# Patient Record
Sex: Male | Born: 1940 | Race: Black or African American | Hispanic: No | Marital: Married | Smoking: Former smoker
Health system: Southern US, Community
[De-identification: ages and names within clinical notes are randomized; demographics above are authoritative.]

## PROBLEM LIST (undated history)

## (undated) DIAGNOSIS — IMO0002 Reserved for concepts with insufficient information to code with codable children: Secondary | ICD-10-CM

## (undated) DIAGNOSIS — R569 Unspecified convulsions: Secondary | ICD-10-CM

## (undated) DIAGNOSIS — E1140 Type 2 diabetes mellitus with diabetic neuropathy, unspecified: Secondary | ICD-10-CM

## (undated) DIAGNOSIS — N183 Chronic kidney disease, stage 3 unspecified: Secondary | ICD-10-CM

## (undated) DIAGNOSIS — E785 Hyperlipidemia, unspecified: Secondary | ICD-10-CM

## (undated) DIAGNOSIS — I10 Essential (primary) hypertension: Secondary | ICD-10-CM

## (undated) DIAGNOSIS — E1165 Type 2 diabetes mellitus with hyperglycemia: Secondary | ICD-10-CM

## (undated) DIAGNOSIS — I639 Cerebral infarction, unspecified: Secondary | ICD-10-CM

## (undated) DIAGNOSIS — G8194 Hemiplegia, unspecified affecting left nondominant side: Secondary | ICD-10-CM

## (undated) DIAGNOSIS — J45909 Unspecified asthma, uncomplicated: Secondary | ICD-10-CM

## (undated) HISTORY — PX: EYE SURGERY: SHX253

## (undated) HISTORY — DX: Unspecified asthma, uncomplicated: J45.909

## (undated) HISTORY — PX: CARDIAC CATHETERIZATION: SHX172

## (undated) HISTORY — PX: CARDIOVASCULAR STRESS TEST: SHX262

---

## 1998-11-16 DIAGNOSIS — I639 Cerebral infarction, unspecified: Secondary | ICD-10-CM

## 1998-11-16 HISTORY — DX: Cerebral infarction, unspecified: I63.9

## 2007-07-14 ENCOUNTER — Emergency Department (HOSPITAL_COMMUNITY): Admission: EM | Admit: 2007-07-14 | Discharge: 2007-07-15 | Payer: Self-pay | Admitting: Emergency Medicine

## 2011-08-28 LAB — COMPREHENSIVE METABOLIC PANEL
Albumin: 3.9
Alkaline Phosphatase: 57
BUN: 19
Calcium: 9.4
Potassium: 3.2 — ABNORMAL LOW
Total Protein: 8.3

## 2011-08-28 LAB — POCT CARDIAC MARKERS
CKMB, poc: 1.5
Myoglobin, poc: 148
Operator id: 1192
Troponin i, poc: <0.05

## 2011-08-28 LAB — B-NATRIURETIC PEPTIDE (CONVERTED LAB): Pro B Natriuretic peptide (BNP): <30

## 2011-08-28 LAB — DIFFERENTIAL
Basophils Relative: 0
Lymphocytes Relative: 22
Lymphs Abs: 2.8
Monocytes Absolute: 1.4 — ABNORMAL HIGH
Monocytes Relative: 11
Neutro Abs: 8.2 — ABNORMAL HIGH
Neutrophils Relative %: 65

## 2011-08-28 LAB — CBC
HCT: 45.2
MCHC: 33.5
Platelets: 184
RDW: 15.1 — ABNORMAL HIGH

## 2013-10-15 ENCOUNTER — Encounter (HOSPITAL_COMMUNITY): Payer: Self-pay | Admitting: Emergency Medicine

## 2013-10-15 ENCOUNTER — Emergency Department (HOSPITAL_COMMUNITY)
Admission: EM | Admit: 2013-10-15 | Discharge: 2013-10-15 | Disposition: A | Discharge status: Home / Self Care | Payer: Medicare Other | Attending: Emergency Medicine | Admitting: Emergency Medicine

## 2013-10-15 DIAGNOSIS — Z8673 Personal history of transient ischemic attack (TIA), and cerebral infarction without residual deficits: Secondary | ICD-10-CM | POA: Insufficient documentation

## 2013-10-15 DIAGNOSIS — E119 Type 2 diabetes mellitus without complications: Secondary | ICD-10-CM | POA: Insufficient documentation

## 2013-10-15 DIAGNOSIS — Z880 Allergy status to penicillin: Secondary | ICD-10-CM | POA: Insufficient documentation

## 2013-10-15 DIAGNOSIS — R739 Hyperglycemia, unspecified: Secondary | ICD-10-CM

## 2013-10-15 DIAGNOSIS — Z87891 Personal history of nicotine dependence: Secondary | ICD-10-CM | POA: Insufficient documentation

## 2013-10-15 HISTORY — DX: Cerebral infarction, unspecified: I63.9

## 2013-10-15 LAB — BASIC METABOLIC PANEL
BUN: 33 mg/dL — ABNORMAL HIGH (ref 6–23)
CO2: 27 mEq/L (ref 19–32)
Calcium: 9.3 mg/dL (ref 8.4–10.5)
Chloride: 93 mEq/L — ABNORMAL LOW (ref 96–112)
Creatinine, Ser: 1.64 mg/dL — ABNORMAL HIGH (ref 0.50–1.35)
GFR calc Af Amer: 46 mL/min — ABNORMAL LOW (ref >90)
GFR calc non Af Amer: 40 mL/min — ABNORMAL LOW (ref >90)
Glucose, Bld: 529 mg/dL — ABNORMAL HIGH (ref 70–99)
Potassium: 3.6 mEq/L (ref 3.5–5.1)
Sodium: 133 mEq/L — ABNORMAL LOW (ref 135–145)

## 2013-10-15 LAB — URINALYSIS, ROUTINE W REFLEX MICROSCOPIC
Bilirubin Urine: NEGATIVE
Glucose, UA: >1000 mg/dL — AB
Hgb urine dipstick: NEGATIVE
Ketones, ur: NEGATIVE mg/dL
Leukocytes, UA: NEGATIVE
Nitrite: NEGATIVE
Protein, ur: NEGATIVE mg/dL
Specific Gravity, Urine: 1.035 — ABNORMAL HIGH (ref 1.005–1.030)
Urobilinogen, UA: 1 mg/dL (ref 0.0–1.0)
pH: 5.5 (ref 5.0–8.0)

## 2013-10-15 LAB — CBC WITH DIFFERENTIAL/PLATELET
Basophils Absolute: 0 10*3/uL (ref 0.0–0.1)
Basophils Relative: 0 % (ref 0–1)
Eosinophils Absolute: 0.1 10*3/uL (ref 0.0–0.7)
Eosinophils Relative: 1 % (ref 0–5)
HCT: 41.5 % (ref 39.0–52.0)
Hemoglobin: 14.2 g/dL (ref 13.0–17.0)
Lymphocytes Relative: 22 % (ref 12–46)
Lymphs Abs: 1.7 10*3/uL (ref 0.7–4.0)
MCH: 26.4 pg (ref 26.0–34.0)
MCHC: 34.2 g/dL (ref 30.0–36.0)
MCV: 77.3 fL — ABNORMAL LOW (ref 78.0–100.0)
Monocytes Absolute: 0.6 10*3/uL (ref 0.1–1.0)
Monocytes Relative: 8 % (ref 3–12)
Neutro Abs: 5.2 10*3/uL (ref 1.7–7.7)
Neutrophils Relative %: 69 % (ref 43–77)
Platelets: 123 10*3/uL — ABNORMAL LOW (ref 150–400)
RBC: 5.37 MIL/uL (ref 4.22–5.81)
RDW: 13.7 % (ref 11.5–15.5)
WBC: 7.6 10*3/uL (ref 4.0–10.5)

## 2013-10-15 LAB — GLUCOSE, CAPILLARY
Glucose-Capillary: 486 mg/dL — ABNORMAL HIGH (ref 70–99)
Glucose-Capillary: 400 mg/dL — ABNORMAL HIGH (ref 70–99)
Glucose-Capillary: 267 mg/dL — ABNORMAL HIGH (ref 70–99)

## 2013-10-15 LAB — URINE MICROSCOPIC-ADD ON

## 2013-10-15 MED ORDER — SODIUM CHLORIDE 0.9 % IV BOLUS (SEPSIS)
1000.0000 mL | Freq: Once | INTRAVENOUS | Status: AC
  Administered 2013-10-15: 1000 mL via INTRAVENOUS

## 2013-10-15 MED ORDER — INSULIN ASPART 100 UNIT/ML ~~LOC~~ SOLN
15.0000 [IU] | Freq: Once | SUBCUTANEOUS | Status: AC
  Administered 2013-10-15: 15 [IU] via INTRAVENOUS
  Filled 2013-10-15: qty 1

## 2013-10-15 MED ORDER — INSULIN ASPART 100 UNIT/ML ~~LOC~~ SOLN
25.0000 [IU] | Freq: Once | SUBCUTANEOUS | Status: AC
  Administered 2013-10-15: 25 [IU] via INTRAVENOUS
  Filled 2013-10-15: qty 1

## 2013-10-15 NOTE — ED Notes (Signed)
Bed: WA19 Expected date:  Expected time:  Means of arrival:  Comments: EMS 

## 2013-10-15 NOTE — ED Provider Notes (Signed)
CSN: [id number]     Arrival date & time 10/15/13  6734 History   First MD Initiated Contact with Patient 10/15/13 0957     Chief Complaint  Patient presents with  . Hyperglycemia   (Consider location/radiation/quality/duration/timing/severity/associated sxs/prior Treatment) HPI   72 year old male with hyperglycemia. Patient reports that he recently moved to this area and that his new physician changed his insulin dosing. Since then, he reports that his blood sugars have been unusually high for him. Patient is not sure why specifically his insulin regimen was changed. She additionally says that his diet has changed somewhat since moving. He states he eats more carbs such as potatoes. Has been feeling somewhat fatigued. Thursday. Hasn't noticed any changes in his urination. No fevers or chills. No rash. Denies any pain.  Past Medical History  Diagnosis Date  . Stroke 2000    left side deficits  . Diabetes mellitus without complication    Past Surgical History  Procedure Laterality Date  . Eye surgery     No family history on file. History  Substance Use Topics  . Smoking status: Former Games developer  . Smokeless tobacco: Not on file  . Alcohol Use: No    Review of Systems  All systems reviewed and negative, other than as noted in HPI.  Allergies  Coumadin and Penicillins  Home Medications  No current outpatient prescriptions on file. BP 138/103  Pulse 86  Temp(Src) 98.5 F (36.9 C) (Oral)  Resp 20  SpO2 100% Physical Exam  Nursing note and vitals reviewed. Constitutional: He is oriented to person, place, and time. He appears well-developed and well-nourished. No distress.  HENT:  Head: Normocephalic and atraumatic.  Eyes: Conjunctivae are normal. Right eye exhibits no discharge. Left eye exhibits no discharge.  Neck: Neck supple.  Cardiovascular: Normal rate, regular rhythm and normal heart sounds.  Exam reveals no gallop and no friction rub.   No murmur  heard. Pulmonary/Chest: Effort normal and breath sounds normal. No respiratory distress.  Abdominal: Soft. He exhibits no distension. There is no tenderness.  Musculoskeletal: He exhibits no edema and no tenderness.  Neurological: He is alert and oriented to person, place, and time. No cranial nerve deficit. He exhibits normal muscle tone. Coordination normal.  Skin: Skin is warm and dry.  Psychiatric: He has a normal mood and affect. His behavior is normal. Thought content normal.    ED Course  Procedures (including critical care time) Labs Review Labs Reviewed  GLUCOSE, CAPILLARY - Abnormal; Notable for the following:    Glucose-Capillary 486 (*)    All other components within normal limits  BASIC METABOLIC PANEL - Abnormal; Notable for the following:    Sodium 133 (*)    Chloride 93 (*)    Glucose, Bld 529 (*)    BUN 33 (*)    Creatinine, Ser 1.64 (*)    GFR calc non Af Amer 40 (*)    GFR calc Af Amer 46 (*)    All other components within normal limits  CBC WITH DIFFERENTIAL - Abnormal; Notable for the following:    MCV 77.3 (*)    Platelets 123 (*)    All other components within normal limits  URINALYSIS, ROUTINE W REFLEX MICROSCOPIC - Abnormal; Notable for the following:    Specific Gravity, Urine 1.035 (*)    Glucose, UA >1000 (*)    All other components within normal limits  GLUCOSE, CAPILLARY - Abnormal; Notable for the following:    Glucose-Capillary 400 (*)  All other components within normal limits  GLUCOSE, CAPILLARY - Abnormal; Notable for the following:    Glucose-Capillary 267 (*)    All other components within normal limits  URINE MICROSCOPIC-ADD ON   Imaging Review No results found.  EKG Interpretation   None       MDM   1. Hyperglycemia    72 year old male with significant hyperglycemia. No anion gap. No ketonuria. Some renal insufficiency of unknown chronicity. Possibly some element of prerenal with BUN to creatinine ratio of 20-1 and high  specific gravity on urinalysis. Received IV fluids and insulin. Blood sugar has come down into a more reasonable range. Patient had no further complaints. Instructed to keep a detailed log of his blood sugars over the next several days and the need to bring this to his PCP. He needs to make an appointment within the next few days to address this further. Emergent return precautions were discussed.   Raeford Razor, MD 10/18/13 [phone]

## 2013-10-15 NOTE — ED Notes (Addendum)
Per EMS: Hyperglycemic pt BG >600, 18 g right forearm. Denies pain. PCP decreased insulin recently.

## 2013-11-30 ENCOUNTER — Inpatient Hospital Stay (HOSPITAL_COMMUNITY): Payer: Medicare Other

## 2013-11-30 ENCOUNTER — Encounter (HOSPITAL_COMMUNITY): Payer: Self-pay | Admitting: Emergency Medicine

## 2013-11-30 ENCOUNTER — Emergency Department (HOSPITAL_COMMUNITY): Payer: Medicare Other

## 2013-11-30 ENCOUNTER — Inpatient Hospital Stay (HOSPITAL_COMMUNITY)
Admission: EM | Admit: 2013-11-30 | Discharge: 2013-12-02 | DRG: 074 | Disposition: A | Discharge status: Home Health | Payer: Medicare Other | Attending: Internal Medicine | Admitting: Internal Medicine

## 2013-11-30 DIAGNOSIS — N183 Chronic kidney disease, stage 3 unspecified: Secondary | ICD-10-CM | POA: Diagnosis present

## 2013-11-30 DIAGNOSIS — I10 Essential (primary) hypertension: Secondary | ICD-10-CM

## 2013-11-30 DIAGNOSIS — R3589 Other polyuria: Secondary | ICD-10-CM | POA: Diagnosis present

## 2013-11-30 DIAGNOSIS — E1142 Type 2 diabetes mellitus with diabetic polyneuropathy: Secondary | ICD-10-CM | POA: Diagnosis present

## 2013-11-30 DIAGNOSIS — E876 Hypokalemia: Secondary | ICD-10-CM | POA: Diagnosis present

## 2013-11-30 DIAGNOSIS — Z794 Long term (current) use of insulin: Secondary | ICD-10-CM

## 2013-11-30 DIAGNOSIS — G8194 Hemiplegia, unspecified affecting left nondominant side: Secondary | ICD-10-CM

## 2013-11-30 DIAGNOSIS — R5383 Other fatigue: Secondary | ICD-10-CM

## 2013-11-30 DIAGNOSIS — IMO0002 Reserved for concepts with insufficient information to code with codable children: Secondary | ICD-10-CM | POA: Diagnosis present

## 2013-11-30 DIAGNOSIS — R55 Syncope and collapse: Secondary | ICD-10-CM | POA: Diagnosis present

## 2013-11-30 DIAGNOSIS — I69998 Other sequelae following unspecified cerebrovascular disease: Secondary | ICD-10-CM

## 2013-11-30 DIAGNOSIS — I69959 Hemiplegia and hemiparesis following unspecified cerebrovascular disease affecting unspecified side: Secondary | ICD-10-CM

## 2013-11-30 DIAGNOSIS — I129 Hypertensive chronic kidney disease with stage 1 through stage 4 chronic kidney disease, or unspecified chronic kidney disease: Secondary | ICD-10-CM | POA: Diagnosis present

## 2013-11-30 DIAGNOSIS — R5381 Other malaise: Secondary | ICD-10-CM | POA: Diagnosis present

## 2013-11-30 DIAGNOSIS — R358 Other polyuria: Secondary | ICD-10-CM

## 2013-11-30 DIAGNOSIS — B3749 Other urogenital candidiasis: Secondary | ICD-10-CM | POA: Diagnosis present

## 2013-11-30 DIAGNOSIS — E1149 Type 2 diabetes mellitus with other diabetic neurological complication: Principal | ICD-10-CM | POA: Diagnosis present

## 2013-11-30 DIAGNOSIS — E1165 Type 2 diabetes mellitus with hyperglycemia: Secondary | ICD-10-CM

## 2013-11-30 DIAGNOSIS — IMO0001 Reserved for inherently not codable concepts without codable children: Secondary | ICD-10-CM

## 2013-11-30 DIAGNOSIS — G8190 Hemiplegia, unspecified affecting unspecified side: Secondary | ICD-10-CM | POA: Diagnosis present

## 2013-11-30 DIAGNOSIS — E1140 Type 2 diabetes mellitus with diabetic neuropathy, unspecified: Secondary | ICD-10-CM

## 2013-11-30 HISTORY — DX: Chronic kidney disease, stage 3 (moderate): N18.3

## 2013-11-30 HISTORY — DX: Hemiplegia, unspecified affecting left nondominant side: G81.94

## 2013-11-30 HISTORY — DX: Chronic kidney disease, stage 3 unspecified: N18.30

## 2013-11-30 HISTORY — DX: Essential (primary) hypertension: I10

## 2013-11-30 HISTORY — DX: Type 2 diabetes mellitus with diabetic neuropathy, unspecified: E11.40

## 2013-11-30 HISTORY — DX: Reserved for concepts with insufficient information to code with codable children: IMO0002

## 2013-11-30 HISTORY — DX: Type 2 diabetes mellitus with hyperglycemia: E11.65

## 2013-11-30 LAB — BASIC METABOLIC PANEL
BUN: 35 mg/dL — ABNORMAL HIGH (ref 6–23)
CO2: 31 mEq/L (ref 19–32)
Calcium: 9.9 mg/dL (ref 8.4–10.5)
Chloride: 88 mEq/L — ABNORMAL LOW (ref 96–112)
Creatinine, Ser: 1.47 mg/dL — ABNORMAL HIGH (ref 0.50–1.35)
GFR calc non Af Amer: 46 mL/min — ABNORMAL LOW (ref >90)
GFR, EST AFRICAN AMERICAN: 53 mL/min — AB (ref >90)
GLUCOSE: 227 mg/dL — AB (ref 70–99)
POTASSIUM: 2.8 meq/L — AB (ref 3.7–5.3)
SODIUM: 135 meq/L — AB (ref 137–147)

## 2013-11-30 LAB — CBC WITH DIFFERENTIAL/PLATELET
BASOS PCT: 0 % (ref 0–1)
Basophils Absolute: 0 10*3/uL (ref 0.0–0.1)
EOS ABS: 0.1 10*3/uL (ref 0.0–0.7)
Eosinophils Relative: 1 % (ref 0–5)
HCT: 43.9 % (ref 39.0–52.0)
Hemoglobin: 14.6 g/dL (ref 13.0–17.0)
LYMPHS ABS: 1.8 10*3/uL (ref 0.7–4.0)
Lymphocytes Relative: 22 % (ref 12–46)
MCH: 25.4 pg — AB (ref 26.0–34.0)
MCHC: 33.3 g/dL (ref 30.0–36.0)
MCV: 76.3 fL — ABNORMAL LOW (ref 78.0–100.0)
Monocytes Absolute: 0.5 10*3/uL (ref 0.1–1.0)
Monocytes Relative: 5 % (ref 3–12)
NEUTROS ABS: 6 10*3/uL (ref 1.7–7.7)
NEUTROS PCT: 72 % (ref 43–77)
Platelets: 180 10*3/uL (ref 150–400)
RBC: 5.75 MIL/uL (ref 4.22–5.81)
RDW: 13.5 % (ref 11.5–15.5)
WBC: 8.4 10*3/uL (ref 4.0–10.5)

## 2013-11-30 LAB — PRO B NATRIURETIC PEPTIDE: Pro B Natriuretic peptide (BNP): 24 pg/mL (ref 0–125)

## 2013-11-30 LAB — URINALYSIS, ROUTINE W REFLEX MICROSCOPIC
Bilirubin Urine: NEGATIVE
Glucose, UA: NEGATIVE mg/dL
Hgb urine dipstick: NEGATIVE
Ketones, ur: NEGATIVE mg/dL
NITRITE: NEGATIVE
Protein, ur: NEGATIVE mg/dL
SPECIFIC GRAVITY, URINE: 1.012 (ref 1.005–1.030)
Urobilinogen, UA: 0.2 mg/dL (ref 0.0–1.0)
pH: 5.5 (ref 5.0–8.0)

## 2013-11-30 LAB — POCT I-STAT, CHEM 8
BUN: 35 mg/dL — ABNORMAL HIGH (ref 6–23)
Calcium, Ion: 1.17 mmol/L (ref 1.13–1.30)
Chloride: 89 mEq/L — ABNORMAL LOW (ref 96–112)
Creatinine, Ser: 1.6 mg/dL — ABNORMAL HIGH (ref 0.50–1.35)
Glucose, Bld: 220 mg/dL — ABNORMAL HIGH (ref 70–99)
HEMATOCRIT: 55 % — AB (ref 39.0–52.0)
HEMOGLOBIN: 18.7 g/dL — AB (ref 13.0–17.0)
POTASSIUM: 2.8 meq/L — AB (ref 3.7–5.3)
SODIUM: 136 meq/L — AB (ref 137–147)
TCO2: 32 mmol/L (ref 0–100)

## 2013-11-30 LAB — CBC
HCT: 44.3 % (ref 39.0–52.0)
Hemoglobin: 15 g/dL (ref 13.0–17.0)
MCH: 26 pg (ref 26.0–34.0)
MCHC: 33.9 g/dL (ref 30.0–36.0)
MCV: 76.9 fL — ABNORMAL LOW (ref 78.0–100.0)
PLATELETS: 195 10*3/uL (ref 150–400)
RBC: 5.76 MIL/uL (ref 4.22–5.81)
RDW: 13.6 % (ref 11.5–15.5)
WBC: 11.5 10*3/uL — AB (ref 4.0–10.5)

## 2013-11-30 LAB — CREATININE, SERUM
CREATININE: 1.53 mg/dL — AB (ref 0.50–1.35)
GFR, EST AFRICAN AMERICAN: 50 mL/min — AB (ref >90)
GFR, EST NON AFRICAN AMERICAN: 43 mL/min — AB (ref >90)

## 2013-11-30 LAB — GLUCOSE, CAPILLARY
GLUCOSE-CAPILLARY: 355 mg/dL — AB (ref 70–99)
Glucose-Capillary: 185 mg/dL — ABNORMAL HIGH (ref 70–99)

## 2013-11-30 LAB — MAGNESIUM: Magnesium: 2 mg/dL (ref 1.5–2.5)

## 2013-11-30 LAB — POCT I-STAT TROPONIN I: Troponin i, poc: 0 ng/mL (ref 0.00–0.08)

## 2013-11-30 LAB — URINE MICROSCOPIC-ADD ON

## 2013-11-30 LAB — TROPONIN I

## 2013-11-30 MED ORDER — INSULIN ASPART 100 UNIT/ML ~~LOC~~ SOLN
0.0000 [IU] | Freq: Three times a day (TID) | SUBCUTANEOUS | Status: DC
  Administered 2013-12-01: 18:00:00 8 [IU] via SUBCUTANEOUS
  Administered 2013-12-01: 3 [IU] via SUBCUTANEOUS
  Administered 2013-12-02: 08:00:00 2 [IU] via SUBCUTANEOUS

## 2013-11-30 MED ORDER — ACETAMINOPHEN 650 MG RE SUPP: 650.0000 mg | Freq: Four times a day (QID) | RECTAL | Status: DC | PRN

## 2013-11-30 MED ORDER — POTASSIUM CHLORIDE 20 MEQ/15ML (10%) PO LIQD
40.0000 meq | Freq: Once | ORAL | Status: AC
  Administered 2013-11-30: 40 meq via ORAL
  Filled 2013-11-30: qty 30

## 2013-11-30 MED ORDER — DOCUSATE SODIUM 100 MG PO CAPS
100.0000 mg | ORAL_CAPSULE | Freq: Two times a day (BID) | ORAL | Status: DC | PRN
  Administered 2013-12-01: 21:00:00 100 mg via ORAL
  Filled 2013-11-30: qty 1

## 2013-11-30 MED ORDER — POTASSIUM CHLORIDE CRYS ER 20 MEQ PO TBCR
20.0000 meq | EXTENDED_RELEASE_TABLET | Freq: Two times a day (BID) | ORAL | Status: DC
  Administered 2013-11-30 – 2013-12-02 (×4): 20 meq via ORAL
  Filled 2013-11-30 (×5): qty 1

## 2013-11-30 MED ORDER — HYDROCODONE-ACETAMINOPHEN 5-325 MG PO TABS: 1.0000 | ORAL_TABLET | ORAL | Status: DC | PRN

## 2013-11-30 MED ORDER — INSULIN GLARGINE 100 UNIT/ML ~~LOC~~ SOLN
60.0000 [IU] | Freq: Every day | SUBCUTANEOUS | Status: DC
  Administered 2013-11-30 – 2013-12-01 (×2): 60 [IU] via SUBCUTANEOUS
  Filled 2013-11-30 (×2): qty 0.6

## 2013-11-30 MED ORDER — POTASSIUM CHLORIDE 10 MEQ/100ML IV SOLN
10.0000 meq | Freq: Once | INTRAVENOUS | Status: AC
  Administered 2013-11-30: 10 meq via INTRAVENOUS
  Filled 2013-11-30: qty 100

## 2013-11-30 MED ORDER — SODIUM CHLORIDE 0.9 % IJ SOLN: 3.0000 mL | Freq: Two times a day (BID) | INTRAMUSCULAR | Status: DC

## 2013-11-30 MED ORDER — INSULIN ASPART 100 UNIT/ML ~~LOC~~ SOLN
55.0000 [IU] | Freq: Three times a day (TID) | SUBCUTANEOUS | Status: DC
  Administered 2013-12-01: 09:00:00 55 [IU] via SUBCUTANEOUS

## 2013-11-30 MED ORDER — FLUCONAZOLE 100 MG PO TABS
100.0000 mg | ORAL_TABLET | ORAL | Status: DC
  Administered 2013-11-30 – 2013-12-01 (×2): 100 mg via ORAL
  Filled 2013-11-30 (×3): qty 1

## 2013-11-30 MED ORDER — SODIUM CHLORIDE 0.9 % IV SOLN
Freq: Once | INTRAVENOUS | Status: AC
  Administered 2013-11-30: 13:00:00 via INTRAVENOUS

## 2013-11-30 MED ORDER — ACETAMINOPHEN 325 MG PO TABS: 650.0000 mg | ORAL_TABLET | Freq: Four times a day (QID) | ORAL | Status: DC | PRN

## 2013-11-30 MED ORDER — ACETAMINOPHEN 325 MG PO TABS
650.0000 mg | ORAL_TABLET | Freq: Once | ORAL | Status: AC
  Administered 2013-11-30: 650 mg via ORAL
  Filled 2013-11-30: qty 2

## 2013-11-30 MED ORDER — LOSARTAN POTASSIUM 50 MG PO TABS
50.0000 mg | ORAL_TABLET | Freq: Every day | ORAL | Status: DC
  Administered 2013-12-01 – 2013-12-02 (×2): 50 mg via ORAL
  Filled 2013-11-30 (×2): qty 1

## 2013-11-30 MED ORDER — LEVOFLOXACIN 750 MG PO TABS
750.0000 mg | ORAL_TABLET | ORAL | Status: DC
  Administered 2013-11-30: 750 mg via ORAL
  Filled 2013-11-30: qty 1

## 2013-11-30 MED ORDER — POTASSIUM CHLORIDE IN NACL 40-0.9 MEQ/L-% IV SOLN
INTRAVENOUS | Status: DC
  Administered 2013-11-30 – 2013-12-02 (×4): via INTRAVENOUS
  Filled 2013-11-30 (×4): qty 1000

## 2013-11-30 MED ORDER — CHLORTHALIDONE 25 MG PO TABS
25.0000 mg | ORAL_TABLET | Freq: Every day | ORAL | Status: DC
  Administered 2013-12-01: 11:00:00 25 mg via ORAL
  Filled 2013-11-30: qty 1

## 2013-11-30 MED ORDER — ADULT MULTIVITAMIN W/MINERALS CH
1.0000 | ORAL_TABLET | Freq: Every day | ORAL | Status: DC
  Administered 2013-11-30 – 2013-12-02 (×3): 1 via ORAL
  Filled 2013-11-30 (×3): qty 1

## 2013-11-30 MED ORDER — SODIUM CHLORIDE 0.9 % IV BOLUS (SEPSIS)
1000.0000 mL | Freq: Once | INTRAVENOUS | Status: AC
  Administered 2013-11-30: 1000 mL via INTRAVENOUS

## 2013-11-30 MED ORDER — POTASSIUM CHLORIDE 20 MEQ/15ML (10%) PO LIQD: 40.0000 meq | Freq: Once | ORAL | Status: DC

## 2013-11-30 MED ORDER — HEPARIN SODIUM (PORCINE) 5000 UNIT/ML IJ SOLN
5000.0000 [IU] | Freq: Three times a day (TID) | INTRAMUSCULAR | Status: DC
  Administered 2013-11-30 – 2013-12-02 (×5): 5000 [IU] via SUBCUTANEOUS
  Filled 2013-11-30 (×8): qty 1

## 2013-11-30 NOTE — Progress Notes (Signed)
ANTIBIOTIC CONSULT NOTE - INITIAL  Pharmacy Consult for Levaquin Indication: UTI  Allergies  Allergen Reactions  . Coumadin [Warfarin Sodium]     Heart races  . Penicillins Hives    Patient Measurements:     Vital Signs: Temp: 97.6 F (36.4 C) (01/15 1740) Temp src: Oral (01/15 1740) BP: 123/108 mmHg (01/15 1750) Pulse Rate: 111 (01/15 1750) Intake/Output from previous day:   Intake/Output from this shift:    Labs:  Recent Labs  11/30/13 1200 11/30/13 1244  WBC 8.4  --   HGB 14.6 18.7*  PLT 180  --   CREATININE 1.47* 1.60*   CrCl is unknown because there is no height on file for the current visit. No results found for this basename: VANCOTROUGH, VANCOPEAK, VANCORANDOM, GENTTROUGH, GENTPEAK, GENTRANDOM, TOBRATROUGH, TOBRAPEAK, TOBRARND, AMIKACINPEAK, AMIKACINTROU, AMIKACIN,  in the last 72 hours   Microbiology: No results found for this or any previous visit (from the past 720 hour(s)).  Medical History: Past Medical History  Diagnosis Date  . Stroke 2000    left side deficits  . Uncontrolled diabetes mellitus   . Left hemiparesis   . Hypertension   . Diabetic neuropathy   . Chronic progressive renal failure, stage 3 (moderate)     Medications:  Prescriptions prior to admission  Medication Sig Dispense Refill  . chlorthalidone (HYGROTON) 25 MG tablet Take 25 mg by mouth daily.      . cholecalciferol (VITAMIN D) 1000 UNITS tablet Take 1,000 Units by mouth daily.      . insulin glargine (LANTUS) 100 UNIT/ML injection Inject 60 Units into the skin at bedtime.      . insulin lispro (HUMALOG) 100 UNIT/ML injection Inject 60 Units into the skin 3 (three) times daily before meals.      Marland Kitchen. losartan (COZAAR) 50 MG tablet Take 50 mg by mouth daily.      . Multiple Vitamins-Minerals (MULTIVITAMIN WITH MINERALS) tablet Take 1 tablet by mouth daily.      . potassium chloride (K-DUR,KLOR-CON) 10 MEQ tablet Take 10 mEq by mouth daily.      . vitamin E 1000 UNIT  capsule Take 1,000 Units by mouth daily.       Assessment: 72yo M passed out. UA turbid and positive for yeast. Fluconazole ordered x 3 and pharmacy is asked to dose Levaquin for possibility of bacterial UTI.  SCr elevated at 1.6. CrCl ~ 41N.  Goal of Therapy:  Eradication of infection Appropriate dosing for renal function   Plan:  Levaquin 750mg  PO q48h. Duration? Agree with fluconazole 100mg  PO q24h x 3. Follow up renal fxn and culture results.  Charolotte Ekeom Shariyah Eland, PharmD, pager [phone]. 11/30/2013,6:20 PM.

## 2013-11-30 NOTE — ED Notes (Signed)
Pt states was sitting on toilet; found by wife laying in floor between commode and sink; pt states felt weak; feels like is related to low sugar--cbg 237 in triage;

## 2013-11-30 NOTE — ED Notes (Signed)
CRITICAL VALUE ALERT  Critical value received:  Potassium 2.8 mEq/L Date of notification:  11/30/2013 Time of notification:  1326 Critical value read back:yes Nurse who received alert: Kai LevinsJeremy Mayelin Panos, RN MD notified: Expected result, same result as i-stat, pt already receiving oral and IV potassium

## 2013-11-30 NOTE — H&P (Signed)
History and Physical Examination   Alejandro [name] ZOX:[id number] DOB: 1941/10/27 DOA: 11/30/2013  Referring physician: Wilkie Aye, MD PCP: Alejandro German, MD   Chief Complaint: passed out   HPI: Alejandro [name] is a 73 y.o. 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  He has had 2 other similar episodes where he just passed out for no reason. The patient states that he remembers being in the bathroom about to check his blood sugar and the next thing he remembers is his wife waking him up on the floor. Patient has no memory of events. He did not have any cough, aura or presyncope feelings. He denies any dizziness, chest pain and shortness of breath.  He denies any recent illnesses. He states that he "just doesn't feel great."  He has been experiencing progressive weakness over the past several days. He has also experienced cramping in his legs.   In the ER the patient was evaluated and found to have significant hypokalemia.  He takes a diuretic daily for hypertension called chlorthalidone.  He was not found to be orthostatic.  A hospital admission was requested for further evaluation and management.  He did not show any confusion although he did show some weakness and confusion right after the event when his wife discovered him on the bathroom floor.  She had to call her daughter to come and help him to get off the bathroom floor. An initial CT scan was negative.       Past Medical History Past Medical History  Diagnosis Date  . Stroke 2000    left side deficits  . Uncontrolled diabetes mellitus   . Left hemiparesis   . Hypertension   . Diabetic neuropathy   . Chronic progressive renal failure, stage 3 (moderate)    Past Surgical History Past Surgical History   Procedure Laterality Date  . Eye surgery    . Cardiac catheterization    . Cardiovascular stress test     Home Meds: Prior to Admission medications   Medication Sig Start Date End Date Taking? Authorizing Provider  chlorthalidone (HYGROTON) 25 MG tablet Take 25 mg by mouth daily.   Yes Historical Provider, MD  cholecalciferol (VITAMIN D) 1000 UNITS tablet Take 1,000 Units by mouth daily.   Yes Historical Provider, MD  insulin glargine (LANTUS) 100 UNIT/ML injection Inject 60 Units into the skin at bedtime.   Yes Historical Provider, MD  insulin lispro (HUMALOG) 100 UNIT/ML injection Inject 60 Units into the skin 3 (three) times daily before meals.   Yes Historical Provider, MD  losartan (COZAAR) 50 MG tablet Take 50 mg by mouth daily.   Yes Historical Provider, MD  Multiple Vitamins-Minerals (MULTIVITAMIN WITH MINERALS) tablet Take 1 tablet by mouth daily.   Yes Historical Provider, MD  potassium chloride (K-DUR,KLOR-CON) 10 MEQ tablet Take 10 mEq by mouth daily.   Yes Historical Provider, MD  vitamin E 1000 UNIT capsule Take 1,000 Units by mouth daily.   Yes Historical Provider, MD   Allergies: Coumadin and Penicillins  Social History:  History   Social History  . Marital Status: Married    Spouse Name: N/A    Number of Children: N/A  . Years of Education: N/A   Occupational History  . Not on file.   Social History Main Topics  . Smoking  status: Former Games developer  . Smokeless tobacco: Not on file  . Alcohol Use: No  . Drug Use: No  . Sexual Activity: Not on file   Other Topics Concern  . Not on file   Social History Narrative  . No narrative on file   Family History:  Family History  Problem Relation Age of Onset  . Hypertension     Review of Systems:  Review of Systems  Constitutional: Negative for fever, chills, weight loss and malaise/fatigue.  HENT: Negative for congestion, ear discharge, ear pain, hearing loss, nosebleeds and tinnitus.   Eyes: Positive for  blurred vision. Negative for double vision, photophobia, pain, discharge and redness.  Respiratory: Negative for cough, hemoptysis, sputum production, shortness of breath, wheezing and stridor.   Cardiovascular: Negative for chest pain, palpitations, orthopnea, claudication, leg swelling and PND.  Gastrointestinal: Negative for heartburn, nausea, vomiting, abdominal pain and diarrhea.  Genitourinary: Positive for urgency and frequency. Negative for dysuria, hematuria and flank pain.  Musculoskeletal: Positive for falls, joint pain and myalgias. Negative for neck pain.       Left shoulder pain   Skin: Positive for itching. Negative for rash.  Neurological: Positive for loss of consciousness and weakness. Negative for dizziness, tingling, tremors, focal weakness, seizures and headaches.  Endo/Heme/Allergies: Negative for environmental allergies and polydipsia. Does not bruise/bleed easily.  Psychiatric/Behavioral: Negative for depression, suicidal ideas, hallucinations, memory loss and substance abuse. The patient is not nervous/anxious and does not have insomnia.   All other systems reviewed and are negative.  Physical Exam: Blood pressure 95/69, pulse 104, temperature 98 F (36.7 C), temperature source Oral, resp. rate 16, SpO2 95.00%. General appearance: alert, cooperative, appears stated age and no distress Head: Normocephalic, without obvious abnormality, atraumatic Eyes: negative findings: lids and lashes normal, conjunctivae and sclerae normal, corneas clear, pupils equal, round, reactive to light and accomodation and visual fields full to confrontation Nose: Nares normal. Septum midline. Mucosa normal. No drainage or sinus tenderness., no discharge, no sinus tenderness Throat: abnormal findings: cheilitis Neck: no adenopathy, no carotid bruit, no JVD, supple, symmetrical, trachea midline and thyroid not enlarged, symmetric, no tenderness/mass/nodules Back: symmetric, no curvature. ROM  normal. No CVA tenderness. Lungs: clear to auscultation bilaterally and normal percussion bilaterally Chest wall: no tenderness Heart: regular rate and rhythm and S1, S2 normal Abdomen: soft, non-tender; bowel sounds normal; no masses,  no organomegaly Extremities: extremities normal, atraumatic, no cyanosis or edema Pulses: 2+ and symmetric Skin: Skin color, texture, turgor normal. No rashes or lesions Lymph nodes: Cervical, supraclavicular, and axillary nodes normal. Neurologic: Mental status: Alert, oriented, thought content appropriate Motor: pronounced left hemiparesis, contracture of left upper extremity Reflexes: 2+ RUE/RLE, 1+ LLE  Lab  And Imaging results:  Results for orders placed during the hospital encounter of 11/30/13 (from the past 24 hour(s))  CBC WITH DIFFERENTIAL     Status: Abnormal   Collection Time    11/30/13 12:00 PM      Result Value Range   WBC 8.4  4.0 - 10.5 K/uL   RBC 5.75  4.22 - 5.81 MIL/uL   Hemoglobin 14.6  13.0 - 17.0 g/dL   HCT 16.1  09.6 - 04.5 %   MCV 76.3 (*) 78.0 - 100.0 fL   MCH 25.4 (*) 26.0 - 34.0 pg   MCHC 33.3  30.0 - 36.0 g/dL   RDW 40.9  81.1 - 91.4 %   Platelets 180  150 - 400 K/uL   Neutrophils Relative % 72  43 - 77 %   Neutro Abs 6.0  1.7 - 7.7 K/uL   Lymphocytes Relative 22  12 - 46 %   Lymphs Abs 1.8  0.7 - 4.0 K/uL   Monocytes Relative 5  3 - 12 %   Monocytes Absolute 0.5  0.1 - 1.0 K/uL   Eosinophils Relative 1  0 - 5 %   Eosinophils Absolute 0.1  0.0 - 0.7 K/uL   Basophils Relative 0  0 - 1 %   Basophils Absolute 0.0  0.0 - 0.1 K/uL  BASIC METABOLIC PANEL     Status: Abnormal   Collection Time    11/30/13 12:00 PM      Result Value Range   Sodium 135 (*) 137 - 147 mEq/L   Potassium 2.8 (*) 3.7 - 5.3 mEq/L   Chloride 88 (*) 96 - 112 mEq/L   CO2 31  19 - 32 mEq/L   Glucose, Bld 227 (*) 70 - 99 mg/dL   BUN 35 (*) 6 - 23 mg/dL   Creatinine, Ser 1.611.47 (*) 0.50 - 1.35 mg/dL   Calcium 9.9  8.4 - 09.610.5 mg/dL   GFR calc non  Af Amer 46 (*) >90 mL/min   GFR calc Af Amer 53 (*) >90 mL/min  POCT I-STAT, CHEM 8     Status: Abnormal   Collection Time    11/30/13 12:44 PM      Result Value Range   Sodium 136 (*) 137 - 147 mEq/L   Potassium 2.8 (*) 3.7 - 5.3 mEq/L   Chloride 89 (*) 96 - 112 mEq/L   BUN 35 (*) 6 - 23 mg/dL   Creatinine, Ser 0.451.60 (*) 0.50 - 1.35 mg/dL   Glucose, Bld 409220 (*) 70 - 99 mg/dL   Calcium, Ion 8.111.17  9.141.13 - 1.30 mmol/L   TCO2 32  0 - 100 mmol/L   Hemoglobin 18.7 (*) 13.0 - 17.0 g/dL   HCT 78.255.0 (*) 95.639.0 - 21.352.0 %   Comment NOTIFIED PHYSICIAN    URINALYSIS, ROUTINE W REFLEX MICROSCOPIC     Status: Abnormal   Collection Time    11/30/13 12:55 PM      Result Value Range   Color, Urine YELLOW  YELLOW   APPearance CLOUDY (*) CLEAR   Specific Gravity, Urine 1.012  1.005 - 1.030   pH 5.5  5.0 - 8.0   Glucose, UA NEGATIVE  NEGATIVE mg/dL   Hgb urine dipstick NEGATIVE  NEGATIVE   Bilirubin Urine NEGATIVE  NEGATIVE   Ketones, ur NEGATIVE  NEGATIVE mg/dL   Protein, ur NEGATIVE  NEGATIVE mg/dL   Urobilinogen, UA 0.2  0.0 - 1.0 mg/dL   Nitrite NEGATIVE  NEGATIVE   Leukocytes, UA SMALL (*) NEGATIVE  URINE MICROSCOPIC-ADD ON     Status: None   Collection Time    11/30/13 12:55 PM      Result Value Range   Squamous Epithelial / LPF RARE  RARE   WBC, UA 3-6  <3 WBC/hpf   Urine-Other FEW YEAST    POCT I-STAT TROPONIN I     Status: None   Collection Time    11/30/13  2:25 PM      Result Value Range   Troponin i, poc 0.00  0.00 - 0.08 ng/mL   Comment 3             Impression / Plan   Principal Problem:   Syncope and collapse - Admit to continuous telemetry monitored bed.  Cycle  cardiac enzymes to evaluate for acute cardiac ischemia. Pt reports that he had a normal cath done several years ago.  He may need a holter monitor at discharge.  He is not orthostatic at this time but clinically is dehydrated.  Will provide IVFs, replace electrolytes and control blood glucose.  Check Carotids, ECHO,  MRI brain and follow results. Consider cardiology eval pending initial treatment and workup.  Also, consider neuro consult pending further evaluation.   Active Problems:   Hypokalemia - will replace aggressively IV, this is likely from chronic chlorthalidone use for hypertension, if he continues this medication he will need to take a higher dose of potassium supplement daily and have his BMP monitored closely.  Will check Magnesium level.      Uncontrolled diabetes mellitus - will resume his home basal bolus regimen for now, provide supplemental insulin for high blood glucose readings, will provide him with a carb modified diet, check a hemoglobin A1c.     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     Polyuria / UTI with yeast seen - will treat with levaquin and fluconazole empirically pending urine cultures, although this likely is a yeast UTI given history of recent poorly controlled hyperglycemia.   Family Communication:  Wife and daughter at bedside Code Status: FULL  Standley Dakins MD [facility] [place], [place] [phone] 11/30/2013, 3:05 PM

## 2013-11-30 NOTE — ED Provider Notes (Signed)
CSN: [id number]     Arrival date & time 11/30/13  1134 History   First MD Initiated Contact with Patient 11/30/13 1156     Chief Complaint  Patient presents with  . Loss of Consciousness   (Consider location/radiation/quality/duration/timing/severity/associated sxs/prior Treatment) HPI  This is a 73 year old male with history of CVA with left-sided deficits and diabetes who presents following an episode of syncope. Patient states that he remembers being in the bathroom about to check his blood sugar. The next thing his wife was waking him up on the floor.  Patient has no memory of events. He did not have any cough or presyncope feelings. He denies any dizziness. The patient's family states that he has done this at least 2 other times where he just appears to "pass out all of a sudden." He denies any chest pain or shortness of breath during these episodes. He denies any recent illnesses. He states that he "just doesn't feel great." He denies any new weakness or numbness.  Past Medical History  Diagnosis Date  . Stroke 2000    left side deficits  . Uncontrolled diabetes mellitus   . Left hemiparesis   . Hypertension   . Diabetic neuropathy   . Chronic progressive renal failure, stage 3 (moderate)    Past Surgical History  Procedure Laterality Date  . Eye surgery    . Cardiac catheterization    . Cardiovascular stress test     Family History  Problem Relation Age of Onset  . Hypertension     History  Substance Use Topics  . Smoking status: Former Games developermoker  . Smokeless tobacco: Never Used  . Alcohol Use: No    Review of Systems  Constitutional: Negative.  Negative for fever.  Respiratory: Negative.  Negative for chest tightness and shortness of breath.   Cardiovascular: Negative.  Negative for chest pain.  Gastrointestinal: Negative.  Negative for abdominal pain.  Genitourinary: Negative.  Negative for dysuria.  Musculoskeletal: Negative for back pain.  Skin: Negative for  rash.  Neurological: Positive for syncope. Negative for weakness and headaches.  All other systems reviewed and are negative.    Allergies  Coumadin and Penicillins  Home Medications   Current Outpatient Rx  Name  Route  Sig  Dispense  Refill  . chlorthalidone (HYGROTON) 25 MG tablet   Oral   Take 25 mg by mouth daily.         . cholecalciferol (VITAMIN D) 1000 UNITS tablet   Oral   Take 1,000 Units by mouth daily.         . insulin glargine (LANTUS) 100 UNIT/ML injection   Subcutaneous   Inject 60 Units into the skin at bedtime.         . insulin lispro (HUMALOG) 100 UNIT/ML injection   Subcutaneous   Inject 60 Units into the skin 3 (three) times daily before meals.         Marland Kitchen. losartan (COZAAR) 50 MG tablet   Oral   Take 50 mg by mouth daily.         . Multiple Vitamins-Minerals (MULTIVITAMIN WITH MINERALS) tablet   Oral   Take 1 tablet by mouth daily.         . potassium chloride (K-DUR,KLOR-CON) 10 MEQ tablet   Oral   Take 10 mEq by mouth daily.         . vitamin E 1000 UNIT capsule   Oral   Take 1,000 Units by mouth daily.  BP 95/69  Pulse 104  Temp(Src) 98 F (36.7 C) (Oral)  Resp 16  SpO2 95% Physical Exam  Nursing note and vitals reviewed. Constitutional: He is oriented to person, place, and time. He appears well-developed and well-nourished. No distress.  Elderly  HENT:  Head: Normocephalic and atraumatic.  Eyes: Pupils are equal, round, and reactive to light.  Neck: Normal range of motion. Neck supple.  No C-spine tenderness  Cardiovascular: Normal rate, regular rhythm and normal heart sounds.   No murmur heard. Pulmonary/Chest: Effort normal and breath sounds normal. No respiratory distress. He has no wheezes.  Abdominal: Soft. Bowel sounds are normal. There is no tenderness. There is no rebound.  Musculoskeletal: He exhibits no edema.  Lymphadenopathy:    He has no cervical adenopathy.  Neurological: He is alert and  oriented to person, place, and time. No cranial nerve deficit. Coordination normal.  5 out of 5 strength in the right upper and right lower extremity, 4+ out of 5 strength left lower extremity, 1/5 strength in grip strength of the left hand  Skin: Skin is warm and dry.  Psychiatric: He has a normal mood and affect.    ED Course  Procedures (including critical care time) Labs Review Labs Reviewed  CBC WITH DIFFERENTIAL - Abnormal; Notable for the following:    MCV 76.3 (*)    MCH 25.4 (*)    All other components within normal limits  BASIC METABOLIC PANEL - Abnormal; Notable for the following:    Sodium 135 (*)    Potassium 2.8 (*)    Chloride 88 (*)    Glucose, Bld 227 (*)    BUN 35 (*)    Creatinine, Ser 1.47 (*)    GFR calc non Af Amer 46 (*)    GFR calc Af Amer 53 (*)    All other components within normal limits  URINALYSIS, ROUTINE W REFLEX MICROSCOPIC - Abnormal; Notable for the following:    APPearance CLOUDY (*)    Leukocytes, UA SMALL (*)    All other components within normal limits  POCT I-STAT, CHEM 8 - Abnormal; Notable for the following:    Sodium 136 (*)    Potassium 2.8 (*)    Chloride 89 (*)    BUN 35 (*)    Creatinine, Ser 1.60 (*)    Glucose, Bld 220 (*)    Hemoglobin 18.7 (*)    HCT 55.0 (*)    All other components within normal limits  URINE MICROSCOPIC-ADD ON  POCT I-STAT TROPONIN I   Imaging Review Ct Head Wo Contrast  11/30/2013   CLINICAL DATA:  Loss of consciousness  EXAM: CT HEAD WITHOUT CONTRAST  TECHNIQUE: Contiguous axial images were obtained from the base of the skull through the vertex without intravenous contrast.  COMPARISON:  None.  FINDINGS: Diffuse moderate to severe areas of low-attenuation project within the subcortical, and T, and periventricular white matter regions. There is a brace confluence within the periventricular white matter regions. There is no evidence of mass effect. Is no evidence of intra-axial nor extra-axial fluid  collections no evidence of acute hemorrhage. There is no evidence of a depressed skull fracture. There are areas of mucosal thickening within the right maxillary sinus. The mastoid air cells are patent.  IMPRESSION: 1. Small vessel white matter ischemic changes without evidence of focal or acute abnormalities.   Electronically Signed   By: Salome Holmes M.D.   On: 11/30/2013 12:55    EKG Interpretation  Date/Time:  Thursday November 30 2013 11:56:00 EST Ventricular Rate:  89 PR Interval:  170 QRS Duration: 77 QT Interval:  381 QTC Calculation: 464 R Axis:   56 Text Interpretation:  Sinus rhythm Borderline T abnormalities, diffuse leads No significant change since last tracing Confirmed by HORTON  MD, COURTNEY ([id number]) on 11/30/2013 1:55:26 PM            MDM   1. Syncope   2. Hypokalemia    Patient presents with syncope. Story is concerning given patient has had no presyncope or are a. He is not orthostatic. He is back to baseline. He has residual neurologic deficit. Workup is notable for a potassium of 2.8. EKG shows no evidence of acute arrhythmia. Additional workup including CT scan is negative. Patient was given potassium. Given syncope and hypokalemia, patient will be admitted for observation and telemetry. He may need a Holter monitor to monitor for arrhythmia.    Shon Baton, MD 11/30/13 [phone]

## 2013-12-01 ENCOUNTER — Inpatient Hospital Stay (HOSPITAL_COMMUNITY)
Admit: 2013-12-01 | Discharge: 2013-12-01 | Disposition: A | Discharge status: Home / Self Care | Payer: Medicare Other | Attending: Internal Medicine | Admitting: Internal Medicine

## 2013-12-01 DIAGNOSIS — R55 Syncope and collapse: Secondary | ICD-10-CM

## 2013-12-01 DIAGNOSIS — I517 Cardiomegaly: Secondary | ICD-10-CM

## 2013-12-01 LAB — COMPREHENSIVE METABOLIC PANEL
ALK PHOS: 77 U/L (ref 39–117)
ALT: 21 U/L (ref 0–53)
AST: 38 U/L — ABNORMAL HIGH (ref 0–37)
Albumin: 2.9 g/dL — ABNORMAL LOW (ref 3.5–5.2)
BUN: 30 mg/dL — ABNORMAL HIGH (ref 6–23)
CO2: 26 mEq/L (ref 19–32)
Calcium: 9.2 mg/dL (ref 8.4–10.5)
Chloride: 99 mEq/L (ref 96–112)
Creatinine, Ser: 1.41 mg/dL — ABNORMAL HIGH (ref 0.50–1.35)
GFR calc non Af Amer: 48 mL/min — ABNORMAL LOW (ref >90)
GFR, EST AFRICAN AMERICAN: 56 mL/min — AB (ref >90)
Glucose, Bld: 207 mg/dL — ABNORMAL HIGH (ref 70–99)
Potassium: 3.8 mEq/L (ref 3.7–5.3)
Sodium: 139 mEq/L (ref 137–147)
Total Bilirubin: 0.5 mg/dL (ref 0.3–1.2)
Total Protein: 6.8 g/dL (ref 6.0–8.3)

## 2013-12-01 LAB — TROPONIN I
Troponin I: <0.3 ng/mL (ref <0.30)
Troponin I: <0.3 ng/mL (ref <0.30)

## 2013-12-01 LAB — CBC
HCT: 38.2 % — ABNORMAL LOW (ref 39.0–52.0)
Hemoglobin: 12.5 g/dL — ABNORMAL LOW (ref 13.0–17.0)
MCH: 25.2 pg — ABNORMAL LOW (ref 26.0–34.0)
MCHC: 32.7 g/dL (ref 30.0–36.0)
MCV: 77 fL — AB (ref 78.0–100.0)
Platelets: 154 10*3/uL (ref 150–400)
RBC: 4.96 MIL/uL (ref 4.22–5.81)
RDW: 13.9 % (ref 11.5–15.5)
WBC: 7.7 10*3/uL (ref 4.0–10.5)

## 2013-12-01 LAB — GLUCOSE, CAPILLARY
Glucose-Capillary: 237 mg/dL — ABNORMAL HIGH (ref 70–99)
Glucose-Capillary: 166 mg/dL — ABNORMAL HIGH (ref 70–99)
Glucose-Capillary: 74 mg/dL (ref 70–99)

## 2013-12-01 LAB — HEMOGLOBIN A1C
Hgb A1c MFr Bld: 14.8 % — ABNORMAL HIGH (ref <5.7)
Mean Plasma Glucose: 378 mg/dL — ABNORMAL HIGH (ref <117)

## 2013-12-01 LAB — TSH: TSH: 0.976 u[IU]/mL (ref 0.350–4.500)

## 2013-12-01 MED ORDER — LEVOFLOXACIN 500 MG PO TABS
500.0000 mg | ORAL_TABLET | Freq: Every day | ORAL | Status: DC
  Administered 2013-12-01: 21:00:00 500 mg via ORAL
  Filled 2013-12-01 (×2): qty 1

## 2013-12-01 MED ORDER — CHLORTHALIDONE 25 MG PO TABS
12.5000 mg | ORAL_TABLET | Freq: Every day | ORAL | Status: DC
  Administered 2013-12-02: 10:00:00 12.5 mg via ORAL
  Filled 2013-12-01: qty 0.5

## 2013-12-01 MED ORDER — LEVOFLOXACIN 750 MG PO TABS
750.0000 mg | ORAL_TABLET | Freq: Every day | ORAL | Status: DC
  Filled 2013-12-01: qty 1

## 2013-12-01 MED ORDER — INSULIN ASPART 100 UNIT/ML ~~LOC~~ SOLN
40.0000 [IU] | Freq: Three times a day (TID) | SUBCUTANEOUS | Status: DC
  Administered 2013-12-01 – 2013-12-02 (×2): 40 [IU] via SUBCUTANEOUS

## 2013-12-01 NOTE — Evaluation (Addendum)
Physical Therapy Evaluation Patient Details Name: Trinton Prewitt MRN: [id number] DOB: 1941-06-14 Today's Date: 12/01/2013 Time: 4098-1191 PT Time Calculation (min): 17 min  PT Assessment / Plan / Recommendation History of Present Illness  Kasir Hallenbeck is a 73 y.o. 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  He has had 2 other similar episodes where he just passed out for no reason. The patient states that he remembers being in the bathroom about to check his blood sugar and the next thing he remembers is his wife waking him up on the floor. Patient has no memory of events. He did not have any cough, aura or presyncope feelings. He denies any dizziness, chest pain and shortness of breath.  He denies any recent illnesses. He states that he "just doesn't feel great."  He has been experiencing progressive weakness over the past several days. He has also experienced cramping in his legs. Found to be hypokalemic.  Clinical Impression  Pt  Presents with unsteady gait but did ambulate without UE support. Pt did much better with R UE support. Pt will benefit from PT to address problems and improve safety for [facility] home. Recommend HHPT at D[facility].    PT Assessment  Patient needs continued PT services    Follow Up Recommendations  Home health PT    Does the patient have the potential to tolerate intense rehabilitation      Barriers to Discharge        Equipment Recommendations  None recommended by PT    Recommendations for Other Services     Frequency Min 3X/week    Precautions / Restrictions Precautions Precautions: Fall Restrictions Weight Bearing Restrictions: No   Pertinent Vitals/Pain None.      Mobility  Bed Mobility Overal bed mobility: Modified  Independent General bed mobility comments: used bedrail to assist self in getting up from flat Transfers Overall transfer level: Needs assistance Equipment used: 1 person hand held assist Transfers: Sit to/from Stand Sit to Stand: Min guard General transfer comment: pt mildly unsteady on feet but no overt LOB  Ambulation_ pt required 2 person for equipment and safety. Pt much steadier with use of rail in hallway, gait is very slow and halting and listing to the right.  Exercises     PT Diagnosis: Abnormality of gait;Generalized weakness;Hemiplegia non-dominant side  PT Problem List: Decreased strength;Decreased range of motion;Decreased balance;Decreased mobility PT Treatment Interventions: DME instruction;Gait training;Functional mobility training;Therapeutic activities;Balance training;Patient/family education     PT Goals(Current goals can be found in the care plan section) Acute Rehab PT Goals Patient Stated Goal: to go home and get my strength back. PT Goal Formulation: With patient Time For Goal Achievement: 12/15/13 Potential to Achieve Goals: Good  Visit Information  Last PT Received On: 12/01/13 Assistance Needed: +1 Reason for Co-Treatment: For patient/therapist safety OT goals addressed during session: ADL's and self-care History of Present Illness: Wagner Tanzi is a 73 y.o. 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  He has had 2 other similar episodes where he just passed out for no reason. The patient states that he remembers being in the bathroom about  to check his blood sugar and the next thing he remembers is his wife waking him up on the floor. Patient has no memory of events. He did not have any cough, aura or presyncope feelings. He  denies any dizziness, chest pain and shortness of breath.  He denies any recent illnesses. He states that he "just doesn't feel great."  He has been experiencing progressive weakness over the past several days. He has also experienced cramping in his legs. Found to be hypokalemic.       Prior Functioning  Home Living Family/patient expects to be discharged to:: Private residence Living Arrangements: Spouse/significant other Available Help at Discharge: Family;Available 24 hours/day Type of Home: House Home Access: Level entry Home Layout: One level Home Equipment: Cane - single point Prior Function Level of Independence: Independent with assistive device(s) Comments: only uses cane in bad weather. Communication Communication: No difficulties Dominant Hand: Right    Cognition  Cognition Arousal/Alertness: Awake/alert Behavior During Therapy: WFL for tasks assessed/performed Overall Cognitive Status: Within Functional Limits for tasks assessed    Extremity/Trunk Assessment Upper Extremity Assessment Upper Extremity Assessment: Defer to OT evaluation LUE Deficits / Details: pt has functional movement to advance Leg and to ambulate, decreased dorsiflexion LUE Coordination: decreased fine motor;decreased gross motor Lower Extremity Assessment Lower Extremity Assessment: LLE deficits/detail LLE Deficits / Details: decreased dorsiflextion, advances LEG during gait LLE Coordination: decreased fine motor Cervical / Trunk Assessment Cervical / Trunk Assessment: Normal   Balance Balance Overall balance assessment: Needs assistance Sitting-balance support: Feet supported;Bilateral upper extremity supported Sitting balance-Leahy Scale: Normal Standing balance support: During functional activity Standing balance-Leahy Scale: Fair Standing balance comment: pt listing to the R during functional activity  End of Session PT - End of Session Equipment Utilized During Treatment: Gait  belt Activity Tolerance: Patient tolerated treatment well Patient left: in chair;with call bell/phone within reach;with chair alarm set Nurse Communication: Mobility status  GP     Rada HayHill, Paulino Cork Elizabeth 12/01/2013, 12:42 PM Blanchard KelchKaren Aleeyah Bensen PT [phone]

## 2013-12-01 NOTE — Progress Notes (Signed)
Echocardiogram 2D Echocardiogram has been performed.  Revecca Nachtigal 12/01/2013, 12:18 PM

## 2013-12-01 NOTE — Procedures (Signed)
ELECTROENCEPHALOGRAM REPORT   Patient: Alejandro [name]       Room #: [facility] 1426 EEG No. ID: [id number] Age: 73 y.o.        Sex: male Referring Physician: Elvera LennoxGherghe Report Date:  12/01/2013        Interpreting Physician: Thana FarrEYNOLDS,Nicolis Boody D  History: Alejandro [name] is an 73 y.o. male with syncope evaluated to rule out seizure  Medications:  Scheduled: . [START ON 12/02/2013] chlorthalidone  12.5 mg Oral Daily  . fluconazole  100 mg Oral Q24H  . heparin  5,000 Units Subcutaneous Q8H  . insulin aspart  0-15 Units Subcutaneous TID WC  . insulin aspart  40 Units Subcutaneous TID WC  . insulin glargine  60 Units Subcutaneous QHS  . levofloxacin  500 mg Oral QHS  . losartan  50 mg Oral Daily  . multivitamin with minerals  1 tablet Oral Daily  . potassium chloride  20 mEq Oral BID  . sodium chloride  3 mL Intravenous Q12H    Conditions of Recording:  This is a 16 channel EEG carried out with the patient in the awake, drowsy and asleep states.  Description:  The waking background activity consists of a low voltage, symmetrical, fairly well organized, 8.5 Hz alpha activity, seen from the parieto-occipital and posterior temporal regions.  Low voltage fast activity, poorly organized, is seen anteriorly and is at times superimposed on more posterior regions.  A mixture of theta and alpha rhythms are seen from the central and temporal regions. The patient drowses with slowing to irregular, low voltage theta and beta activity.   The patient goes in to a light sleep with symmetrical sleep spindles, vertex central sharp transients and irregular slow activity.  Hyperventilation was not performed and intermittent photic stimulation were not performed.  IMPRESSION: This is a normal electroencephalogram.  No epileptiform activity is noted.     Thana FarrLeslie Izabell Schalk, MD [facility] [phone] 12/01/2013, 5:46 PM

## 2013-12-01 NOTE — Progress Notes (Signed)
Inpatient Diabetes Program Recommendations  AACE/ADA: New Consensus Statement on Inpatient Glycemic Control (2013)  Target Ranges:  Prepandial:   less than 140 mg/dL      Peak postprandial:   less than 180 mg/dL (1-2 hours)      Critically ill patients:  140 - 180 mg/dL   Results for Alejandro [name], Alejandro [name] (MRN [id number]) as of 12/01/2013 09:57  Ref. Range 11/30/2013 19:44  Hemoglobin A1C Latest Range: <5.7 % 14.8 (H)    Inpatient Diabetes Program Recommendations Outpatient Referral: Recommend patient follow up with outpatient diabetes education.  A1C 14.8% on 11/30/2013.  Placed order for outpatient education.  Thanks, Orlando PennerMarie Znya Albino, RN, MSN, CCRN Diabetes Coordinator Inpatient Diabetes Program [phone] (Team Pager) [phone] ([facility] office) [phone] [facility] office)

## 2013-12-01 NOTE — Progress Notes (Signed)
Physical Therapy Treatment Patient Details Name: Alejandro [name] MRN: [id number] DOB: 02/17/1941 Today's Date: 12/01/2013 Time: 1191-4782 PT Time Calculation (min): 19 min  PT Assessment / Plan / Recommendation  History of Present Illness Alejandro [name] is a 73 y.o. 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  He has had 2 other similar episodes where he just passed out for no reason. The patient states that he remembers being in the bathroom about to check his blood sugar and the next thing he remembers is his wife waking him up on the floor. Patient has no memory of events. He did not have any cough, aura or presyncope feelings. He denies any dizziness, chest pain and shortness of breath.  He denies any recent illnesses. He states that he "just doesn't feel great."  He has been experiencing progressive weakness over the past several days. He has also experienced cramping in his legs. Found to be hypokalemic.   PT Comments   Pt's gait improved with use of cane. Pt may benefit from SNF per family request per case mgr.  Follow Up Recommendations  SNF;Supervision for mobility/OOB;Home health PT (case mag. reports family has concerns for pt's balance and safety and decreased caregiver support.)     Does the patient have the potential to tolerate intense rehabilitation     Barriers to Discharge        Equipment Recommendations  None recommended by PT    Recommendations for Other Services    Frequency Min 3X/week   Progress towards PT Goals Progress towards PT goals: Progressing toward goals  Plan Discharge plan needs to be updated    Precautions / Restrictions Precautions Precautions: Fall Restrictions Weight Bearing Restrictions: No   Pertinent Vitals/Pain      Mobility  Bed Mobility Overal bed mobility: Modified Independent General bed mobility comments: used bedrail to assist self in getting up from flat Transfers Overall transfer level: Needs assistance Equipment used: 1 person hand held assist Transfers: Sit to/from Stand Sit to Stand: Min guard General transfer comment: pt was up in roomwith family heading to the BR Ambulation/Gait Ambulation/Gait assistance: Min guard Ambulation Distance (Feet): 100 Feet Assistive device: Straight cane Gait Pattern/deviations: Step-to pattern;Decreased weight shift to left;Shuffle;Wide base of support;Drifts right/left Gait velocity: slow General Gait Details: gait is steadier with cane but risk for balance loss.    Exercises     PT Diagnosis: Abnormality of gait;Generalized weakness;Hemiplegia non-dominant side  PT Problem List: Decreased strength;Decreased range of motion;Decreased balance;Decreased mobility PT Treatment Interventions: DME instruction;Gait training;Functional mobility training;Therapeutic activities;Balance training;Patient/family education   PT Goals (current goals can now be found in the care plan section) Acute Rehab PT Goals Patient Stated Goal: to go home and get my strength back. PT Goal Formulation: With patient Time For Goal Achievement: 12/15/13 Potential to Achieve Goals: Good  Visit Information  Last PT Received On: 12/01/13 Assistance Needed: +1 PT/OT/SLP Co-Evaluation/Treatment: Yes Reason for Co-Treatment: For patient/therapist safety PT goals addressed during session: Mobility/safety with mobility;Balance OT goals addressed during session: ADL's and self-care History of Present Illness: Alejandro [name] is a 73 y.o. male with cerebrovascular disease, s/p CVA x 2 thirteen years ago with residual left-sided deficits, insulin requiring diabetes mellitus which has been recently difficult to control, chronic renal failure who presented to the ER following a  recurrent episode of syncope.  He has had 2 other similar episodes where he just passed out for no reason. The patient states that he remembers being in the bathroom about to check his blood sugar and the next thing he remembers is his wife waking him up on the floor. Patient has no memory of events. He did not have any cough, aura or presyncope feelings. He denies any dizziness, chest pain and shortness of breath.  He denies any recent illnesses. He states that he "just doesn't feel great."  He has been experiencing progressive weakness over the past several days. He has also experienced cramping in his legs. Found to be hypokalemic.    Subjective Data  Patient Stated Goal: to go home and get my strength back.   Cognition  Cognition Arousal/Alertness: Awake/alert Behavior During Therapy: WFL for tasks assessed/performed Overall Cognitive Status: Within Functional Limits for tasks assessed Area of Impairment: Safety/judgement Safety/Judgement: Decreased awareness of safety    Balance  Balance Overall balance assessment: Needs assistance Sitting-balance support: Feet supported;Bilateral upper extremity supported Sitting balance-Leahy Scale: Normal Standing balance support: During functional activity Standing balance-Leahy Scale: Fair Standing balance comment: pt listing to the R during functional activity  End of Session PT - End of Session Equipment Utilized During Treatment: Gait belt Activity Tolerance: Patient tolerated treatment well Patient left: in chair;with call bell/phone within reach;with family/visitor present;with chair alarm set Nurse Communication: Mobility status   GP     Rada HayHill, Kalyse Meharg Elizabeth 12/01/2013, 3:43 PM Blanchard KelchKaren Lashaunda Schild PT [phone]

## 2013-12-01 NOTE — Progress Notes (Signed)
SLP Cancellation Note  Patient Details Name: Alejandro FuseChristopher [name] MRN: [id number] DOB: Apr 10, 1941   Cancelled treatment:       Reason Eval/Treat Not Completed: Patient at procedure or test/unavailable;Other (comment) (Pt unavailable, due to EEG currently ongoing.  Will check next date.) Per RN, pt eating well, but has chronic cough.  Pt reports this has been present since CVA in 2000.  Will continue efforts.  Nitish Roes B. Murvin NatalBueche, [facility] Jersey LLCMSP, CCC-SLP [phone]   Leigh AuroraBueche, Samiksha Pellicano Brown 12/01/2013, 3:21 PM

## 2013-12-01 NOTE — Progress Notes (Signed)
CARE MANAGEMENT NOTE 12/01/2013  Patient:  Rossetti,Md   Account Number:  [id number]  Date Initiated:  12/01/2013  Documentation initiated by:  Caroly Purewal  Subjective/Objective Assessment:   syncopal episode in the home, hx of cva and cad     Action/Plan:   home when stable   Anticipated DC Date:  12/04/2013   Anticipated DC Plan:  HOME/SELF CARE  In-house referral  NA      DC Planning Services  NA      [facility]  NA   Choice offered to / List presented to:  NA   DME arranged  NA      DME agency  NA     HH arranged  NA      HH agency  NA   Status of service:  In process, will continue to follow Medicare Important Message given?  NA - LOS <3 / Initial given by admissions (If response is "NO", the following Medicare IM given date fields will be blank) Date Medicare IM given:   Date Additional Medicare IM given:    Discharge Disposition:    Per UR Regulation:  Reviewed for med. necessity/level of care/duration of stay  If discussed at Long Length of Stay Meetings, dates discussed:    Comments:  [id number]/Breya Cass Earlene Plateravis, RN, BSN, CCM: CHART REVIEWED AND UPDATED.  Next chart review due on [id number]. NO DISCHARGE NEEDS PRESENT AT THIS TIME WILL CONTINUE TO FOLLOW. CASE MANAGEMENT [phone]

## 2013-12-01 NOTE — Progress Notes (Signed)
Pt's [facility] and [facility]/NA/PT.  Referral called to [phone] [facility], Wendy, RN. (fax discharge summary, [facility] Fax [phone]).

## 2013-12-01 NOTE — Evaluation (Signed)
Occupational Therapy Evaluation Patient Details Name: Alejandro [name] MRN: [id number] DOB: 1941/04/24 Today's Date: 12/01/2013 Time: 4098-1191 OT Time Calculation (min): 22 min  OT Assessment / Plan / Recommendation History of present illness Alejandro [name] is a 73 y.o. 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  He has had 2 other similar episodes where he just passed out for no reason. The patient states that he remembers being in the bathroom about to check his blood sugar and the next thing he remembers is his wife waking him up on the floor. Patient has no memory of events. He did not have any cough, aura or presyncope feelings. He denies any dizziness, chest pain and shortness of breath.  He denies any recent illnesses. He states that he "just doesn't feel great."  He has been experiencing progressive weakness over the past several days. He has also experienced cramping in his legs. Found to be hypokalemic.   Clinical Impression   Pt admitted for above diagnosis and has the deficits listed below.  Pt overall does well but is just deconditioned from this recent visit to hospital.  Pt would benefit from cont OT to increase I with basic adls to return home safely.    OT Assessment  Patient needs continued OT Services    Follow Up Recommendations  No OT follow up;Supervision/Assistance - 24 hour    Barriers to Discharge      Equipment Recommendations  None recommended by OT    Recommendations for Other Services    Frequency  Min 2X/week    Precautions / Restrictions Precautions Precautions: Fall Restrictions Weight Bearing Restrictions: No   Pertinent Vitals/Pain Pt with no c/o pain.    ADL  Eating/Feeding: Performed;Set up Where Assessed -  Eating/Feeding: Chair Grooming: Simulated;Wash/dry hands;Wash/dry face;Supervision/safety Where Assessed - Grooming: Supported standing Upper Body Bathing: Simulated;Set up Where Assessed - Upper Body Bathing: Supported sitting Lower Body Bathing: Simulated;Minimal assistance Where Assessed - Lower Body Bathing: Supported sit to stand Upper Body Dressing: Simulated;Set up Where Assessed - Upper Body Dressing: Unsupported sitting Lower Body Dressing: Performed;Minimal assistance Where Assessed - Lower Body Dressing: Supported sit to stand Toilet Transfer: Performed;Minimal assistance Toilet Transfer Method: Sit to Barista: Regular height toilet Toileting - Clothing Manipulation and Hygiene: Simulated;Minimal assistance Where Assessed - Toileting Clothing Manipulation and Hygiene: Standing Transfers/Ambulation Related to ADLs: Pt walked in hallway with PT/OT. Pt with significant LLE weakness but has never worn a brace. Pt hip hikes to clean L leg. ADL Comments: Pt does very well with adls and has been I with them since CVA 13 years ago. Pt just deconditioned but able to complete most adls. Needs some S in standing due to decreased balance.    OT Diagnosis: Generalized weakness;Hemiplegia dominant side  OT Problem List: Decreased strength;Decreased range of motion;Decreased activity tolerance;Impaired balance (sitting and/or standing);Decreased coordination;Impaired UE functional use OT Treatment Interventions: Self-care/ADL training;Therapeutic activities   OT Goals(Current goals can be found in the care plan section) Acute Rehab OT Goals Patient Stated Goal: to go home and get my strength back. OT Goal Formulation: With patient Time For Goal Achievement: 12/08/13 Potential to Achieve Goals: Good ADL Goals Pt Will Perform Tub/Shower Transfer: Shower transfer;ambulating;with supervision Additional ADL Goal #1: Pt will do all toileting on comfort commode with  S.  Additional ADL Goal #2: Pt will fully dress self with S.  Visit Information  Last OT Received On: 12/01/13 Assistance Needed: +1 PT/OT/SLP Co-Evaluation/Treatment: Yes Reason for Co-Treatment: For patient/therapist safety OT goals addressed during session: ADL's and self-care History of Present Illness: Alejandro [name] is a 73 y.o. 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  He has had 2 other similar episodes where he just passed out for no reason. The patient states that he remembers being in the bathroom about to check his blood sugar and the next thing he remembers is his wife waking him up on the floor. Patient has no memory of events. He did not have any cough, aura or presyncope feelings. He denies any dizziness, chest pain and shortness of breath.  He denies any recent illnesses. He states that he "just doesn't feel great."  He has been experiencing progressive weakness over the past several days. He has also experienced cramping in his legs. Found to be hypokalemic.       Prior Functioning     Home Living Family/patient expects to be discharged to:: Private residence Living Arrangements: Spouse/significant other Available Help at Discharge: Family;Available 24 hours/day Type of Home: House Home Access: Level entry Home Layout: One level Home Equipment: Cane - single point Prior Function Level of Independence: Independent with assistive device(s) Comments: only uses cane in bad weather. Communication Communication: No difficulties Dominant Hand: Right         Vision/Perception Vision - History Baseline Vision: Wears glasses only for reading Patient Visual Report: No change from baseline Vision - Assessment Vision Assessment: Vision not  tested   Cognition  Cognition Arousal/Alertness: Awake/alert Behavior During Therapy: WFL for tasks assessed/performed Overall Cognitive Status: Within Functional Limits for tasks assessed    Extremity/Trunk Assessment Upper Extremity Assessment Upper Extremity Assessment: LUE deficits/detail LUE Deficits / Details: Pt w residual weakness in L side from CVA 13 years ago. Pt uses LUE as an assist but not functional on its own. LUE Coordination: decreased fine motor;decreased gross motor Lower Extremity Assessment Lower Extremity Assessment: Defer to PT evaluation Cervical / Trunk Assessment Cervical / Trunk Assessment: Normal     Mobility Bed Mobility Overal bed mobility: Modified Independent General bed mobility comments: used bedrail to assist self in getting up from flat Transfers Overall transfer level: Needs assistance Equipment used: 1 person hand held assist Transfers: Sit to/from Stand Sit to Stand: Min guard General transfer comment: pt mildly unsteady on feet but no overt LOB     Exercise     Balance Balance Overall balance assessment: Needs assistance Sitting-balance support: Feet supported;Bilateral upper extremity supported Sitting balance-Leahy Scale: Normal Standing balance support: Single extremity supported;During functional activity Standing balance-Leahy Scale: Fair   End of Session OT - End of Session Activity Tolerance: Patient limited by fatigue Patient left: in chair;with call bell/phone within reach Nurse Communication: Mobility status  GO     Hope BuddsJones, Jalesia Loudenslager Anne 12/01/2013, 12:33 PM [phone]

## 2013-12-01 NOTE — Progress Notes (Signed)
Pharmacy consult - Levaquin  72yoM presented 1/15 following an episode of syncope. UA turbid and positive for yeast. Fluconazole ordered x 3 and pharmacy is asked to dose Levaquin for possibility of bacterial UTI.  1/15 >> Fluconazole (MD) >> 1/17 1/15 >> Levaquin >>   Tmax: afeb WBCs: wnl starting 1/16 Renal: Scr 1.41, CG 55  1/5 UA >> few yeast, small leuks  Today is D#2 of therapy. Note no urine cultures ordered.   Plan: Given improved renal function, will change Levaquin to 500 mg IV q24h. F/u if MD would like to complete course of therapy for UTI despite no culture.  Geoffry Paradisehuyvan Ayrabella Labombard, PharmD, BCPS Pager: [phone] 1:26 PM Pharmacy #: [id number]

## 2013-12-01 NOTE — Progress Notes (Signed)
VASCULAR LAB PRELIMINARY  PRELIMINARY  PRELIMINARY  PRELIMINARY  Carotid duplex completed.    Preliminary report:  Bilateral:  1-39% ICA stenosis.  Vertebral artery flow is antegrade.     Milah Recht, RVS 12/01/2013, 9:29 AM

## 2013-12-01 NOTE — Progress Notes (Signed)
EEG Completed; Results Pending  

## 2013-12-01 NOTE — Progress Notes (Signed)
PROGRESS NOTE  Alejandro [name] UJW:[id number] DOB: 1941-06-13 DOA: 11/30/2013 PCP: Dorrene German, MD  Assessment/Plan: Syncope/weakness - multifactorial, may be due to his hypokalemia, uncontrolled sugars and impressive insulin doses. MRI normal, 2D echo pending, obtain EEG for completeness. On telemetry.  Hypokalemia - will replace aggressively IV, this is likely from chronic chlorthalidone use for hypertension, if he continues this medication he will need to take a higher dose of potassium supplement daily and have his BMP monitored closely. Will check Magnesium level.  Uncontrolled diabetes mellitus - poorly controlled. Patient does not seem to have a good understanding of dietary restrictions and insulin administration. He injects himself with insulin on his legs where there is little fat.I suspect that he might not need as much insulin should he get his insulin correctly. Appreciate diabetes educator teaching. Patient not trusting me when I explained diet and insulin administration and persists in mentioning that vegetables and fruits have a lot of carbs and he needs to avoid them.  Chronic progressive renal failure, stage 3 (moderate) - monitor creatinine, replace potassium, check phos, Vit D level, magnesium. Renal failure likely due to his #2. 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  Polyuria / UTI with yeast seen - will treat with levaquin and fluconazole empirically pending urine cultures, although this likely is a yeast UTI given history of recent poorly controlled hyperglycemia.   Diet: carb Fluids: NS DVT Prophylaxis: heparin  Code Status: Full Family Communication: daughter and wife at bedside  Disposition Plan: home when ready  Consultants:  none  Procedures:  none   Antibiotics  Anti-infectives   Start      Dose/Rate Route Frequency Ordered Stop   12/01/13 2200  levofloxacin (LEVAQUIN) tablet 750 mg  Status:  Discontinued     750 mg Oral Daily at bedtime 12/01/13 0748 12/01/13 1325   12/01/13 2200  levofloxacin (LEVAQUIN) tablet 500 mg     500 mg Oral Daily at bedtime 12/01/13 1325     11/30/13 2000  levofloxacin (LEVAQUIN) tablet 750 mg  Status:  Discontinued     750 mg Oral Every 48 hours 11/30/13 1817 12/01/13 0748   11/30/13 1830  fluconazole (DIFLUCAN) tablet 100 mg     100 mg Oral Every 24 hours 11/30/13 1759 12/03/13 1829     Antibiotics Given (last 72 hours)   Date/Time Action Medication Dose   11/30/13 2100 Given   levofloxacin (LEVAQUIN) tablet 750 mg 750 mg      HPI/Subjective: - no complaints.   Objective: Filed Vitals:   11/30/13 1750 11/30/13 1829 11/30/13 2016 12/01/13 0613  BP: 123/108  107/82 92/62  Pulse: 111  90 81  Temp:   98 F (36.7 C) 98.1 F (36.7 C)  TempSrc:   Oral Oral  Resp:   18 18  Height:  5' 11.5" (1.816 m)    Weight:   94.8 kg (208 lb 15.9 oz)   SpO2:   99% 96%    Intake/Output Summary (Last 24 hours) at 12/01/13 1456 Last data filed at 12/01/13 0903  Gross per 24 hour  Intake    240 ml  Output    700 ml  Net   -460 ml   Filed Weights   11/30/13 2016  Weight: 94.8 kg (208 lb 15.9 oz)    Exam:   General:  NAD  Cardiovascular: regular rate  and rhythm, without MRG  Respiratory: good air movement, clear to auscultation throughout, no wheezing, ronchi or rales  Abdomen: soft, not tender to palpation, positive bowel sounds  MSK: no peripheral edema  Neuro: residual left sided weakness  Data Reviewed: Basic Metabolic Panel:  Recent Labs Lab 11/30/13 1200 11/30/13 1244 11/30/13 1944 12/01/13 0548  NA 135* 136*  --  139  K 2.8* 2.8*  --  3.8  CL 88* 89*  --  99  CO2 31  --   --  26  GLUCOSE 227* 220*  --  207*  BUN 35* 35*  --  30*  CREATININE 1.47* 1.60* 1.53* 1.41*  CALCIUM 9.9  --   --  9.2  MG  --   --  2.0  --      Liver Function Tests:  Recent Labs Lab 12/01/13 0548  AST 38*  ALT 21  ALKPHOS 77  BILITOT 0.5  PROT 6.8  ALBUMIN 2.9*   CBC:  Recent Labs Lab 11/30/13 1200 11/30/13 1244 11/30/13 1944 12/01/13 0548  WBC 8.4  --  11.5* 7.7  NEUTROABS 6.0  --   --   --   HGB 14.6 18.7* 15.0 12.5*  HCT 43.9 55.0* 44.3 38.2*  MCV 76.3*  --  76.9* 77.0*  PLT 180  --  195 154   Cardiac Enzymes:  Recent Labs Lab 11/30/13 1944 12/01/13 0024 12/01/13 0548  TROPONINI <0.30 <0.30 <0.30   BNP (last 3 results)  Recent Labs  11/30/13 1944  PROBNP 24.0   CBG:  Recent Labs Lab 11/30/13 1146 11/30/13 1806 11/30/13 2143 12/01/13 0803 12/01/13 1225  GLUCAP 237* 185* 355* 166* 74    No results found for this or any previous visit (from the past 240 hour(s)).   Studies: Ct Head Wo Contrast  11/30/2013   CLINICAL DATA:  Loss of consciousness  EXAM: CT HEAD WITHOUT CONTRAST  TECHNIQUE: Contiguous axial images were obtained from the base of the skull through the vertex without intravenous contrast.  COMPARISON:  None.  FINDINGS: Diffuse moderate to severe areas of low-attenuation project within the subcortical, and T, and periventricular white matter regions. There is a brace confluence within the periventricular white matter regions. There is no evidence of mass effect. Is no evidence of intra-axial nor extra-axial fluid collections no evidence of acute hemorrhage. There is no evidence of a depressed skull fracture. There are areas of mucosal thickening within the right maxillary sinus. The mastoid air cells are patent.  IMPRESSION: 1. Small vessel white matter ischemic changes without evidence of focal or acute abnormalities.   Electronically Signed   By: Salome HolmesHector  Cooper M.D.   On: 11/30/2013 12:55   Mr Brain Wo Contrast  11/30/2013   CLINICAL DATA:  Stroke.  Syncope.  EXAM: MRI HEAD WITHOUT CONTRAST  TECHNIQUE: Multiplanar, multiecho pulse sequences of the brain and surrounding structures  were obtained without intravenous contrast.  COMPARISON:  CT 11/30/2013  FINDINGS: Negative for acute infarct.  Mild atrophy. Moderate chronic microvascular ischemic changes. Chronic ischemia seen throughout the cerebral white matter and basal ganglia. There is chronic ischemia in the pons. There are scattered areas of chronic micro hemorrhage in the brain likely due to hypertension.  Negative for mass or edema.  Mucosal edema and secretions fill the right maxillary sinus.  IMPRESSION: Moderately severe chronic microvascular ischemia  No acute infarct is identified.   Electronically Signed   By: Marlan Palauharles  Clark M.D.   On: 11/30/2013 19:08  Scheduled Meds: . chlorthalidone  25 mg Oral Daily  . fluconazole  100 mg Oral Q24H  . heparin  5,000 Units Subcutaneous Q8H  . insulin aspart  0-15 Units Subcutaneous TID WC  . insulin aspart  55 Units Subcutaneous TID WC  . insulin glargine  60 Units Subcutaneous QHS  . levofloxacin  500 mg Oral QHS  . losartan  50 mg Oral Daily  . multivitamin with minerals  1 tablet Oral Daily  . potassium chloride  20 mEq Oral BID  . sodium chloride  3 mL Intravenous Q12H   Continuous Infusions: . 0.9 % NaCl with KCl 40 mEq / L 75 mL/hr at 12/01/13 0544    Principal Problem:   Syncope and collapse Active Problems:   Hypokalemia   Uncontrolled diabetes mellitus   Chronic progressive renal failure, stage 3 (moderate)   Diabetic neuropathy   Hypertension   Left hemiparesis   Polyuria  Time spent: 8  Pamella Pert, MD [facility] Pager [phone]. If 7 PM - 7 AM, please contact night-coverage at [email or web address], password [facility] 12/01/2013, 2:56 PM  LOS: 1 day

## 2013-12-01 NOTE — Progress Notes (Deleted)
Spoke with pt concerning Home Health, she declined at present time.  

## 2013-12-02 LAB — CBC
HCT: 39.3 % (ref 39.0–52.0)
Hemoglobin: 12.9 g/dL — ABNORMAL LOW (ref 13.0–17.0)
MCH: 25.6 pg — ABNORMAL LOW (ref 26.0–34.0)
MCHC: 32.8 g/dL (ref 30.0–36.0)
MCV: 78 fL (ref 78.0–100.0)
PLATELETS: 159 10*3/uL (ref 150–400)
RBC: 5.04 MIL/uL (ref 4.22–5.81)
RDW: 14.2 % (ref 11.5–15.5)
WBC: 7.1 10*3/uL (ref 4.0–10.5)

## 2013-12-02 LAB — BASIC METABOLIC PANEL
BUN: 23 mg/dL (ref 6–23)
CO2: 25 mEq/L (ref 19–32)
Calcium: 8.9 mg/dL (ref 8.4–10.5)
Chloride: 102 mEq/L (ref 96–112)
Creatinine, Ser: 1.36 mg/dL — ABNORMAL HIGH (ref 0.50–1.35)
GFR, EST AFRICAN AMERICAN: 58 mL/min — AB (ref >90)
GFR, EST NON AFRICAN AMERICAN: 50 mL/min — AB (ref >90)
Glucose, Bld: 139 mg/dL — ABNORMAL HIGH (ref 70–99)
Potassium: 3.5 mEq/L — ABNORMAL LOW (ref 3.7–5.3)
SODIUM: 140 meq/L (ref 137–147)

## 2013-12-02 MED ORDER — POTASSIUM CHLORIDE CRYS ER 10 MEQ PO TBCR: 20.0000 meq | EXTENDED_RELEASE_TABLET | Freq: Every day | ORAL | Status: AC

## 2013-12-02 MED ORDER — CHLORTHALIDONE 25 MG PO TABS: 12.5000 mg | ORAL_TABLET | Freq: Every day | ORAL | Status: DC

## 2013-12-02 MED ORDER — POTASSIUM CHLORIDE CRYS ER 20 MEQ PO TBCR
40.0000 meq | EXTENDED_RELEASE_TABLET | Freq: Once | ORAL | Status: AC
  Administered 2013-12-02: 20 meq via ORAL
  Filled 2013-12-02: qty 2

## 2013-12-02 NOTE — Discharge Summary (Signed)
Physician Discharge Summary  Alejandro [name] NWG:[id number] DOB: 1941/05/15 DOA: 11/30/2013  PCP: Dorrene German, MD  Admit date: 11/30/2013 Discharge date: 12/02/2013  Time spent: 35 minutes  Recommendations for Outpatient Follow-up:  1. Follow up with PCP in 1 week  Recommendations for primary care physician for things to follow:  Repeat BMP  Discharge Diagnoses:  Principal Problem:   Syncope and collapse Active Problems:   Hypokalemia   Uncontrolled diabetes mellitus   Chronic progressive renal failure, stage 3 (moderate)   Diabetic neuropathy   Hypertension   Left hemiparesis   Polyuria  Discharge Condition: stable  Diet recommendation: diabetic  Filed Weights   11/30/13 2016  Weight: 94.8 kg (208 lb 15.9 oz)   History of present illness:  Alejandro [name] is a 73 y.o. 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 He has had 2 other similar episodes where he just passed out for no reason. The patient states that he remembers being in the bathroom about to check his blood sugar and the next thing he remembers is his wife waking him up on the floor. Patient has no memory of events. He did not have any cough, aura or presyncope feelings. He denies any dizziness, chest pain and shortness of breath. He denies any recent illnesses. He states that he "just doesn't feel great." He has been experiencing progressive weakness over the past several days. He has also experienced cramping in his legs. In the ER the patient was evaluated and found to have significant hypokalemia. He takes a diuretic daily for hypertension called chlorthalidone. He was not found to be orthostatic. A hospital admission was requested for further evaluation and management. He did not  show any confusion although he did show some weakness and confusion right after the event when his wife discovered him on the bathroom floor. She had to call her daughter to come and help him to get off the bathroom floor. An initial CT scan was negative.   Hospital Course:  Syncope/weakness - multifactorial, may be due to his hypokalemia, uncontrolled sugars and impressive insulin doses, as well as a mild component of dehydration. MRI was obtained and was negative for acute CVA (full read below). 2D echo was obtained as well as an EEG which were unremarkable.   Hypokalemia -  Replaced while hospitalized, patient is to have potassium supplementation at home. I did decrease his diuretic to half his previous dose and instructed him to call his PCP office and set up an appointment next week. He already has an appointment on wed with his endocrinologist and will have his K checked at that time.  Uncontrolled diabetes mellitus - Patient does not seem to have a good understanding of dietary restrictions and insulin administration. He injects himself with insulin on his legs where there is little fat. I suspect that he might not need as much insulin should he administer his insulin correctly. Appreciated diabetes educator teaching. Patient very resistant to explanations about diet and insulin administration and persists in mentioning that vegetables and fruits have a lot of carbs and he needs to avoid them and also he knows better where he needs to inject his insulin.  Chronic progressive renal failure, stage 3 (moderate) - stable, improving with IVF, Cr on d/c 1.36 from 1.53 on admission.  Diabetic neuropathy -  pt reports that this has been stable  Hypertension - changed home diuretic as above Chronic Left hemiparesis from CVA 13 years ago - PT evaluation recommended HHPT. HHPT orders placed, CM consulted.   Questionable UTI - no bacteria, patient asymptomatic, no cultures sent without bacteria. He does not need  antibiotics.   Procedures:  2D echo Study Conclusions Left ventricle: The cavity size was normal. Wall thickness was increased in a pattern of mild LVH. Systolic function was vigorous. The estimated ejection fraction was in the range of 65% to 70%. Wall motion was normal; there were no regional wall motion abnormalities.  Carotid duplex Bilateral: 1-39% ICA stenosis. Vertebral artery flow is antegrade (preliminary read)  EEG  This is a normal electroencephalogram. No epileptiform activity is noted.   Consultations:  none  Discharge Exam: Filed Vitals:   12/01/13 1610 12/01/13 1430 12/01/13 2110 12/02/13 0459  BP: 92/62 112/76 124/88 143/92  Pulse: 81 92 83 82  Temp: 98.1 F (36.7 C) 98.1 F (36.7 C) 98.1 F (36.7 C) 97.6 F (36.4 C)  TempSrc: Oral Oral Oral Oral  Resp: 18 20 20 20   Height:      Weight:      SpO2: 96% 96% 99% 99%   General: NAD Cardiovascular: RRR Respiratory: CTA biL  Discharge Instructions  Discharge Orders   Future Orders Complete By Expires   Ambulatory referral to Nutrition and Diabetic Education  As directed    Comments:     A1C 14.8% on 11/30/2013       Medication List         chlorthalidone 25 MG tablet  Commonly known as:  HYGROTON  Take 0.5 tablets (12.5 mg total) by mouth daily.     cholecalciferol 1000 UNITS tablet  Commonly known as:  VITAMIN D  Take 1,000 Units by mouth daily.     insulin glargine 100 UNIT/ML injection  Commonly known as:  LANTUS  Inject 60 Units into the skin at bedtime.     insulin lispro 100 UNIT/ML injection  Commonly known as:  HUMALOG  Inject 60 Units into the skin 3 (three) times daily before meals.     losartan 50 MG tablet  Commonly known as:  COZAAR  Take 50 mg by mouth daily.     multivitamin with minerals tablet  Take 1 tablet by mouth daily.     potassium chloride 10 MEQ tablet  Commonly known as:  K-DUR,KLOR-CON  Take 2 tablets (20 mEq total) by mouth daily.     vitamin E 1000  UNIT capsule  Take 1,000 Units by mouth daily.           Follow-up Information   Follow up with AVBUERE,EDWIN A, MD. Schedule an appointment as soon as possible for a visit in 1 week.   Specialty:  Internal Medicine   Contact information:   [address] [place] [address] [phone]      The results of significant diagnostics from this hospitalization (including imaging, microbiology, ancillary and laboratory) are listed below for reference.    Significant Diagnostic Studies: Ct Head Wo Contrast  11/30/2013   CLINICAL DATA:  Loss of consciousness  EXAM: CT HEAD WITHOUT CONTRAST  TECHNIQUE: Contiguous axial images were obtained from the base of the skull through the vertex without intravenous contrast.  COMPARISON:  None.  FINDINGS: Diffuse moderate to severe areas of low-attenuation project within the subcortical, and T, and periventricular white matter regions. There is a brace confluence within the periventricular white  matter regions. There is no evidence of mass effect. Is no evidence of intra-axial nor extra-axial fluid collections no evidence of acute hemorrhage. There is no evidence of a depressed skull fracture. There are areas of mucosal thickening within the right maxillary sinus. The mastoid air cells are patent.  IMPRESSION: 1. Small vessel white matter ischemic changes without evidence of focal or acute abnormalities.   Electronically Signed   By: Salome HolmesHector  Cooper M.D.   On: 11/30/2013 12:55   Mr Brain Wo Contrast  11/30/2013   CLINICAL DATA:  Stroke.  Syncope.  EXAM: MRI HEAD WITHOUT CONTRAST  TECHNIQUE: Multiplanar, multiecho pulse sequences of the brain and surrounding structures were obtained without intravenous contrast.  COMPARISON:  CT 11/30/2013  FINDINGS: Negative for acute infarct.  Mild atrophy. Moderate chronic microvascular ischemic changes. Chronic ischemia seen throughout the cerebral white matter and basal ganglia. There is chronic ischemia in the pons.  There are scattered areas of chronic micro hemorrhage in the brain likely due to hypertension.  Negative for mass or edema.  Mucosal edema and secretions fill the right maxillary sinus.  IMPRESSION: Moderately severe chronic microvascular ischemia  No acute infarct is identified.   Electronically Signed   By: Marlan Palauharles  Clark M.D.   On: 11/30/2013 19:08   Labs: Basic Metabolic Panel:  Recent Labs Lab 11/30/13 1200 11/30/13 1244 11/30/13 1944 12/01/13 0548 12/02/13 0515  NA 135* 136*  --  139 140  K 2.8* 2.8*  --  3.8 3.5*  CL 88* 89*  --  99 102  CO2 31  --   --  26 25  GLUCOSE 227* 220*  --  207* 139*  BUN 35* 35*  --  30* 23  CREATININE 1.47* 1.60* 1.53* 1.41* 1.36*  CALCIUM 9.9  --   --  9.2 8.9  MG  --   --  2.0  --   --    Liver Function Tests:  Recent Labs Lab 12/01/13 0548  AST 38*  ALT 21  ALKPHOS 77  BILITOT 0.5  PROT 6.8  ALBUMIN 2.9*   CBC:  Recent Labs Lab 11/30/13 1200 11/30/13 1244 11/30/13 1944 12/01/13 0548 12/02/13 0515  WBC 8.4  --  11.5* 7.7 7.1  NEUTROABS 6.0  --   --   --   --   HGB 14.6 18.7* 15.0 12.5* 12.9*  HCT 43.9 55.0* 44.3 38.2* 39.3  MCV 76.3*  --  76.9* 77.0* 78.0  PLT 180  --  195 154 159   Cardiac Enzymes:  Recent Labs Lab 11/30/13 1944 12/01/13 0024 12/01/13 0548  TROPONINI <0.30 <0.30 <0.30   BNP: BNP (last 3 results)  Recent Labs  11/30/13 1944  PROBNP 24.0   CBG:  Recent Labs Lab 11/30/13 1146 11/30/13 1806 11/30/13 2143 12/01/13 0803 12/01/13 1225  GLUCAP 237* 185* 355* 166* 74   Signed:  GHERGHE, COSTIN  [facility] 12/02/2013, 3:49 PM

## 2013-12-02 NOTE — Discharge Instructions (Signed)
You were cared for by a hospitalist during your hospital stay. If you have any questions about your discharge medications or the care you received while you were in the hospital after you are discharged, you can call the unit and asked to speak with the hospitalist on call if the hospitalist that took care of you is not available. Once you are discharged, your primary care physician will handle any further medical issues. Please note that NO REFILLS for any discharge medications will be authorized once you are discharged, as it is imperative that you return to your primary care physician (or establish a relationship with a primary care physician if you do not have one) for your aftercare needs so that they can reassess your need for medications and monitor your lab values.     If you do not have a primary care physician, you can call [phone] for a physician referral.  Follow with Primary MD Dorrene GermanAVBUERE,EDWIN A, MD and your Endocrinologist in 5-7 days   Get CBC, CMP checked by your doctor and again as further instructed.  Get a 2 view Chest X ray done next visit if you had Pneumonia of Lung problems at the Hospital.  Get Medicines reviewed and adjusted.  Please request your Prim.MD to go over all Hospital Tests and Procedure/Radiological results at the follow up, please get all Hospital records sent to your Prim MD by signing hospital release before you go home.  Activity: As tolerated with Full fall precautions use walker/cane & assistance as needed  Diet: diabetic  For Heart failure patients - Check your Weight same time everyday, if you gain over 2 pounds, or you develop in leg swelling, experience more shortness of breath or chest pain, call your Primary MD immediately. Follow Cardiac Low Salt Diet and 1.8 lit/day fluid restriction.  Disposition Home  If you experience worsening of your admission symptoms, develop shortness of breath, life threatening emergency, suicidal or homicidal thoughts  you must seek medical attention immediately by calling 911 or calling your MD immediately  if symptoms less severe.  You Must read complete instructions/literature along with all the possible adverse reactions/side effects for all the Medicines you take and that have been prescribed to you. Take any new Medicines after you have completely understood and accpet all the possible adverse reactions/side effects.   Do not drive and provide baby sitting services if your were admitted for syncope or siezures until you have seen by Primary MD or a Neurologist and advised to do so again.  Do not drive when taking Pain medications.   Do not take more than prescribed Pain, Sleep and Anxiety Medications  Special Instructions: If you have smoked or chewed Tobacco  in the last 2 yrs please stop smoking, stop any regular Alcohol  and or any Recreational drug use.  Wear Seat belts while driving.

## 2013-12-02 NOTE — Progress Notes (Signed)
Pt left at this time with his daughter at his side. Pt alert, oriented, and without c/o. Discharge instructions/prescriptions given/explained with pt verbalizing understanding.  Followup appointment noted.

## 2013-12-04 LAB — GLUCOSE, CAPILLARY
GLUCOSE-CAPILLARY: 266 mg/dL — AB (ref 70–99)
GLUCOSE-CAPILLARY: 285 mg/dL — AB (ref 70–99)
Glucose-Capillary: 145 mg/dL — ABNORMAL HIGH (ref 70–99)

## 2014-01-04 ENCOUNTER — Other Ambulatory Visit: Payer: Self-pay | Admitting: Internal Medicine

## 2014-01-11 ENCOUNTER — Other Ambulatory Visit: Payer: Self-pay | Admitting: Internal Medicine

## 2014-10-18 ENCOUNTER — Other Ambulatory Visit: Payer: Self-pay

## 2014-10-18 ENCOUNTER — Emergency Department (HOSPITAL_COMMUNITY): Payer: Medicare Other

## 2014-10-18 ENCOUNTER — Encounter (HOSPITAL_COMMUNITY): Payer: Self-pay

## 2014-10-18 ENCOUNTER — Emergency Department (HOSPITAL_COMMUNITY)
Admission: EM | Admit: 2014-10-18 | Discharge: 2014-10-19 | Disposition: A | Discharge status: Home / Self Care | Payer: Medicare Other | Attending: Emergency Medicine | Admitting: Emergency Medicine

## 2014-10-18 DIAGNOSIS — S0990XA Unspecified injury of head, initial encounter: Secondary | ICD-10-CM | POA: Diagnosis not present

## 2014-10-18 DIAGNOSIS — S61214A Laceration without foreign body of right ring finger without damage to nail, initial encounter: Secondary | ICD-10-CM | POA: Insufficient documentation

## 2014-10-18 DIAGNOSIS — Z794 Long term (current) use of insulin: Secondary | ICD-10-CM | POA: Diagnosis not present

## 2014-10-18 DIAGNOSIS — S40012A Contusion of left shoulder, initial encounter: Secondary | ICD-10-CM | POA: Insufficient documentation

## 2014-10-18 DIAGNOSIS — I129 Hypertensive chronic kidney disease with stage 1 through stage 4 chronic kidney disease, or unspecified chronic kidney disease: Secondary | ICD-10-CM | POA: Insufficient documentation

## 2014-10-18 DIAGNOSIS — Z9889 Other specified postprocedural states: Secondary | ICD-10-CM | POA: Insufficient documentation

## 2014-10-18 DIAGNOSIS — Z87891 Personal history of nicotine dependence: Secondary | ICD-10-CM | POA: Diagnosis not present

## 2014-10-18 DIAGNOSIS — S161XXA Strain of muscle, fascia and tendon at neck level, initial encounter: Secondary | ICD-10-CM | POA: Diagnosis not present

## 2014-10-18 DIAGNOSIS — E1165 Type 2 diabetes mellitus with hyperglycemia: Secondary | ICD-10-CM | POA: Insufficient documentation

## 2014-10-18 DIAGNOSIS — Y9389 Activity, other specified: Secondary | ICD-10-CM | POA: Insufficient documentation

## 2014-10-18 DIAGNOSIS — Z8673 Personal history of transient ischemic attack (TIA), and cerebral infarction without residual deficits: Secondary | ICD-10-CM | POA: Insufficient documentation

## 2014-10-18 DIAGNOSIS — N183 Chronic kidney disease, stage 3 (moderate): Secondary | ICD-10-CM | POA: Insufficient documentation

## 2014-10-18 DIAGNOSIS — Y998 Other external cause status: Secondary | ICD-10-CM | POA: Insufficient documentation

## 2014-10-18 DIAGNOSIS — R52 Pain, unspecified: Secondary | ICD-10-CM

## 2014-10-18 DIAGNOSIS — R739 Hyperglycemia, unspecified: Secondary | ICD-10-CM

## 2014-10-18 DIAGNOSIS — Z79899 Other long term (current) drug therapy: Secondary | ICD-10-CM | POA: Insufficient documentation

## 2014-10-18 DIAGNOSIS — Y92009 Unspecified place in unspecified non-institutional (private) residence as the place of occurrence of the external cause: Secondary | ICD-10-CM | POA: Diagnosis not present

## 2014-10-18 DIAGNOSIS — Z880 Allergy status to penicillin: Secondary | ICD-10-CM | POA: Insufficient documentation

## 2014-10-18 DIAGNOSIS — R400 Somnolence: Secondary | ICD-10-CM | POA: Diagnosis present

## 2014-10-18 DIAGNOSIS — W182XXA Fall in (into) shower or empty bathtub, initial encounter: Secondary | ICD-10-CM | POA: Insufficient documentation

## 2014-10-18 DIAGNOSIS — R55 Syncope and collapse: Secondary | ICD-10-CM | POA: Insufficient documentation

## 2014-10-18 LAB — CBC WITH DIFFERENTIAL/PLATELET
Basophils Absolute: 0 10*3/uL (ref 0.0–0.1)
Basophils Relative: 0 % (ref 0–1)
Eosinophils Absolute: 0.1 10*3/uL (ref 0.0–0.7)
Eosinophils Relative: 2 % (ref 0–5)
HCT: 43.7 % (ref 39.0–52.0)
Hemoglobin: 14.4 g/dL (ref 13.0–17.0)
LYMPHS ABS: 2.8 10*3/uL (ref 0.7–4.0)
Lymphocytes Relative: 34 % (ref 12–46)
MCH: 25.3 pg — AB (ref 26.0–34.0)
MCHC: 33 g/dL (ref 30.0–36.0)
MCV: 76.7 fL — ABNORMAL LOW (ref 78.0–100.0)
MONO ABS: 0.6 10*3/uL (ref 0.1–1.0)
Monocytes Relative: 7 % (ref 3–12)
NEUTROS ABS: 4.6 10*3/uL (ref 1.7–7.7)
NEUTROS PCT: 57 % (ref 43–77)
Platelets: 174 10*3/uL (ref 150–400)
RBC: 5.7 MIL/uL (ref 4.22–5.81)
RDW: 13.9 % (ref 11.5–15.5)
WBC: 8.1 10*3/uL (ref 4.0–10.5)

## 2014-10-18 LAB — URINALYSIS, ROUTINE W REFLEX MICROSCOPIC
BILIRUBIN URINE: NEGATIVE
Glucose, UA: >1000 mg/dL — AB
KETONES UR: NEGATIVE mg/dL
Leukocytes, UA: NEGATIVE
NITRITE: NEGATIVE
PH: 5 (ref 5.0–8.0)
PROTEIN: 100 mg/dL — AB
Specific Gravity, Urine: 1.024 (ref 1.005–1.030)
UROBILINOGEN UA: 1 mg/dL (ref 0.0–1.0)

## 2014-10-18 LAB — URINE MICROSCOPIC-ADD ON

## 2014-10-18 LAB — COMPREHENSIVE METABOLIC PANEL
ALK PHOS: 88 U/L (ref 39–117)
ALT: 19 U/L (ref 0–53)
ANION GAP: 19 — AB (ref 5–15)
AST: 29 U/L (ref 0–37)
Albumin: 3.3 g/dL — ABNORMAL LOW (ref 3.5–5.2)
BILIRUBIN TOTAL: 0.3 mg/dL (ref 0.3–1.2)
BUN: 18 mg/dL (ref 6–23)
CHLORIDE: 94 meq/L — AB (ref 96–112)
CO2: 23 meq/L (ref 19–32)
CREATININE: 1.42 mg/dL — AB (ref 0.50–1.35)
Calcium: 9.6 mg/dL (ref 8.4–10.5)
GFR, EST AFRICAN AMERICAN: 55 mL/min — AB (ref >90)
GFR, EST NON AFRICAN AMERICAN: 47 mL/min — AB (ref >90)
GLUCOSE: 415 mg/dL — AB (ref 70–99)
POTASSIUM: 4 meq/L (ref 3.7–5.3)
Sodium: 136 mEq/L — ABNORMAL LOW (ref 137–147)
Total Protein: 7.6 g/dL (ref 6.0–8.3)

## 2014-10-18 LAB — CBG MONITORING, ED: Glucose-Capillary: 391 mg/dL — ABNORMAL HIGH (ref 70–99)

## 2014-10-18 MED ORDER — OXYCODONE-ACETAMINOPHEN 5-325 MG PO TABS
ORAL_TABLET | ORAL | Status: AC
  Filled 2014-10-18: qty 2

## 2014-10-18 MED ORDER — MORPHINE SULFATE 4 MG/ML IJ SOLN: 4.0000 mg | Freq: Once | INTRAMUSCULAR | Status: DC

## 2014-10-18 MED ORDER — INSULIN ASPART 100 UNIT/ML ~~LOC~~ SOLN
SUBCUTANEOUS | Status: AC
  Filled 2014-10-18: qty 1

## 2014-10-18 MED ORDER — LIDOCAINE HCL (PF) 1 % IJ SOLN
INTRAMUSCULAR | Status: AC
Start: 2014-10-18 — End: 2014-10-19
  Filled 2014-10-18: qty 30

## 2014-10-18 MED ORDER — OXYCODONE-ACETAMINOPHEN 5-325 MG PO TABS
2.0000 | ORAL_TABLET | Freq: Once | ORAL | Status: AC
  Administered 2014-10-18: 2 via ORAL

## 2014-10-18 MED ORDER — INSULIN ASPART 100 UNIT/ML ~~LOC~~ SOLN
6.0000 [IU] | Freq: Once | SUBCUTANEOUS | Status: AC
  Administered 2014-10-18: 6 [IU] via INTRAVENOUS

## 2014-10-18 NOTE — ED Provider Notes (Signed)
CSN: [id number]     Arrival date & time 10/18/14  2005 History   First MD Initiated Contact with Patient 10/18/14 2030     Chief Complaint  Patient presents with  . Loss of Consciousness     (Consider location/radiation/quality/duration/timing/severity/associated sxs/prior Treatment) HPI Comments: Patient is a 73 year old male with history of hypertension, diabetes, CVA with left hemiparesis. He is brought by EMS after a syncopal episode. He was in the shower this evening and he began to feel lightheaded. He called for his wife and then lost consciousness. Patient has no recollection of this incident and states that he feels fine. He denies any injury and denies any discomfort. His family states that this happened to him once before when his blood sugar was high and they're concerned this has happened again.  Patient is a 73 y.o. male presenting with syncope. The history is provided by the patient.  Loss of Consciousness Episode history:  Single Most recent episode:  Today Progression:  Resolved Chronicity:  New Witnessed: yes   Relieved by:  Nothing Worsened by:  Nothing tried Ineffective treatments:  None tried   Past Medical History  Diagnosis Date  . Stroke 2000    left side deficits  . Uncontrolled diabetes mellitus   . Left hemiparesis   . Hypertension   . Diabetic neuropathy   . Chronic progressive renal failure, stage 3 (moderate)    Past Surgical History  Procedure Laterality Date  . Eye surgery    . Cardiac catheterization    . Cardiovascular stress test     Family History  Problem Relation Age of Onset  . Hypertension     History  Substance Use Topics  . Smoking status: Former Games developermoker  . Smokeless tobacco: Never Used  . Alcohol Use: No    Review of Systems  Cardiovascular: Positive for syncope.  All other systems reviewed and are negative.     Allergies  Coumadin and Penicillins  Home Medications   Prior to Admission medications   Medication  Sig Start Date End Date Taking? Authorizing Provider  chlorthalidone (HYGROTON) 25 MG tablet Take 0.5 tablets (12.5 mg total) by mouth daily. 12/02/13   Costin Otelia SergeantM Gherghe, MD  cholecalciferol (VITAMIN D) 1000 UNITS tablet Take 1,000 Units by mouth daily.    Historical Provider, MD  insulin glargine (LANTUS) 100 UNIT/ML injection Inject 60 Units into the skin at bedtime.    Historical Provider, MD  insulin lispro (HUMALOG) 100 UNIT/ML injection Inject 60 Units into the skin 3 (three) times daily before meals.    Historical Provider, MD  losartan (COZAAR) 50 MG tablet Take 50 mg by mouth daily.    Historical Provider, MD  Multiple Vitamins-Minerals (MULTIVITAMIN WITH MINERALS) tablet Take 1 tablet by mouth daily.    Historical Provider, MD  potassium chloride (K-DUR,KLOR-CON) 10 MEQ tablet Take 2 tablets (20 mEq total) by mouth daily. 12/02/13   Costin Otelia SergeantM Gherghe, MD  vitamin E 1000 UNIT capsule Take 1,000 Units by mouth daily.    Historical Provider, MD   BP 187/120 mmHg  Pulse 103  Resp 20  Ht 6' (1.829 m)  Wt 160 lb (72.576 kg)  BMI 21.70 kg/m2  SpO2 96% Physical Exam  Constitutional: He is oriented to person, place, and time. He appears well-developed and well-nourished. No distress.  HENT:  Head: Normocephalic and atraumatic.  Mouth/Throat: Oropharynx is clear and moist.  Eyes: EOM are normal. Pupils are equal, round, and reactive to light.  Neck: Normal range  of motion. Neck supple.  Cardiovascular: Normal rate, regular rhythm and normal heart sounds.   No murmur heard. Pulmonary/Chest: Effort normal and breath sounds normal. No respiratory distress. He has no wheezes.  Abdominal: Soft. Bowel sounds are normal. He exhibits no distension. There is no tenderness.  Musculoskeletal: Normal range of motion. He exhibits no edema.  Lymphadenopathy:    He has no cervical adenopathy.  Neurological: He is alert and oriented to person, place, and time. No cranial nerve deficit. He exhibits normal  muscle tone. Coordination normal.  Skin: Skin is warm and dry. He is not diaphoretic.  Nursing note and vitals reviewed.   ED Course  Procedures (including critical care time) Labs Review Labs Reviewed  CBC WITH DIFFERENTIAL  COMPREHENSIVE METABOLIC PANEL  URINALYSIS, ROUTINE W REFLEX MICROSCOPIC  POCT CBG (FASTING - GLUCOSE)-MANUAL ENTRY    Imaging Review No results found.   Date: 10/18/2014  Rate: 103  Rhythm: sinus tachycardia  QRS Axis: normal  Intervals: normal  ST/T Wave abnormalities: nonspecific T wave changes  Conduction Disutrbances:none  Narrative Interpretation:   Old EKG Reviewed: none available  LACERATION REPAIR Performed by: Geoffery LyonseLo, Theador Jezewski Authorized by: Geoffery LyonseLo, Jamale Spangler Consent: Verbal consent obtained. Risks and benefits: risks, benefits and alternatives were discussed Consent given by: patient Patient identity confirmed: provided demographic data Prepped and Draped in normal sterile fashion Wound explored  Laceration Location: Right index finger  Laceration Length: 2.5 cm  No Foreign Bodies seen or palpated  Anesthesia: local infiltration  Local anesthetic: lidocaine 1% without epinephrine  Anesthetic total: 2 ml  Irrigation method: syringe Amount of cleaning: standard  Skin closure: 4-0 Ethilon   Number of sutures: 4   Technique: Simple interrupted   Patient tolerance: Patient tolerated the procedure well with no immediate complications.   MDM   Final diagnoses:  None    Patient was brought after a fall in the shower at home. He is uncertain as to what happened and has no recollection of the event. He is neurologically intact and is not complaining of any discomfort. CT of the head and cervical spine are unremarkable and laboratory studies reveal elevated glucose. He has a mildly elevated anion gap, however there are no ketones in his urine and he does not appear significantly ill.  He does have a laceration to his right fourth  finger and is complaining of left shoulder pain. X-rays of the shoulder are negative. The laceration to the right fourth finger was caused by the patient's wedding ring cutting into the skin. The ring required removal with the ring cutter. The wound was explored and there is no tendon laceration. He is able to flex and extend the finger without any difficulty. Repair was performed as above. There was some difficulty closing the wound as the suture would tear through the skin as the edges were approximated. I was able to place 4 stitches an approximate the edges as best I could. I discussed this repair with Dr. Merlyn LotKuzma who feels as though this is an acceptable repair. He will follow him up in the office.  The patient will be discharged to home with pain medication, antibiotics for his finger, and follow-up with hand surgery. Family is to keep an eye on his blood sugars and blood pressure once home.    Geoffery Lyonsouglas Sammy Cassar, MD 10/19/14 [phone]

## 2014-10-18 NOTE — ED Notes (Signed)
[facility], pt was in the shower and had syncopal episode. Did LOC, states he has had this problem before with hyperglycemia. BS 496 per EMS.

## 2014-10-18 NOTE — ED Notes (Signed)
Pt has laceration to right hand from fall

## 2014-10-19 ENCOUNTER — Encounter (HOSPITAL_COMMUNITY): Payer: Self-pay | Admitting: Emergency Medicine

## 2014-10-19 ENCOUNTER — Emergency Department (HOSPITAL_COMMUNITY)
Admission: EM | Admit: 2014-10-19 | Discharge: 2014-10-20 | Disposition: A | Discharge status: Home / Self Care | Payer: Medicare Other | Attending: Emergency Medicine | Admitting: Emergency Medicine

## 2014-10-19 DIAGNOSIS — Z87891 Personal history of nicotine dependence: Secondary | ICD-10-CM | POA: Diagnosis not present

## 2014-10-19 DIAGNOSIS — Z4801 Encounter for change or removal of surgical wound dressing: Secondary | ICD-10-CM | POA: Diagnosis present

## 2014-10-19 DIAGNOSIS — S61214D Laceration without foreign body of right ring finger without damage to nail, subsequent encounter: Secondary | ICD-10-CM | POA: Insufficient documentation

## 2014-10-19 DIAGNOSIS — Z79899 Other long term (current) drug therapy: Secondary | ICD-10-CM | POA: Insufficient documentation

## 2014-10-19 DIAGNOSIS — E1140 Type 2 diabetes mellitus with diabetic neuropathy, unspecified: Secondary | ICD-10-CM | POA: Diagnosis not present

## 2014-10-19 DIAGNOSIS — X58XXXD Exposure to other specified factors, subsequent encounter: Secondary | ICD-10-CM | POA: Diagnosis not present

## 2014-10-19 DIAGNOSIS — Z794 Long term (current) use of insulin: Secondary | ICD-10-CM | POA: Insufficient documentation

## 2014-10-19 DIAGNOSIS — Z8673 Personal history of transient ischemic attack (TIA), and cerebral infarction without residual deficits: Secondary | ICD-10-CM | POA: Insufficient documentation

## 2014-10-19 DIAGNOSIS — Z880 Allergy status to penicillin: Secondary | ICD-10-CM | POA: Insufficient documentation

## 2014-10-19 DIAGNOSIS — I129 Hypertensive chronic kidney disease with stage 1 through stage 4 chronic kidney disease, or unspecified chronic kidney disease: Secondary | ICD-10-CM | POA: Insufficient documentation

## 2014-10-19 DIAGNOSIS — N183 Chronic kidney disease, stage 3 (moderate): Secondary | ICD-10-CM | POA: Insufficient documentation

## 2014-10-19 DIAGNOSIS — IMO0002 Reserved for concepts with insufficient information to code with codable children: Secondary | ICD-10-CM

## 2014-10-19 LAB — CBG MONITORING, ED: Glucose-Capillary: 300 mg/dL — ABNORMAL HIGH (ref 70–99)

## 2014-10-19 MED ORDER — LIDOCAINE HCL (PF) 1 % IJ SOLN
2.0000 mL | Freq: Once | INTRAMUSCULAR | Status: AC
  Administered 2014-10-19: 2 mL

## 2014-10-19 NOTE — Discharge Instructions (Signed)
Keflex as prescribed.  Percocet as prescribed as needed for pain.  Keep dressing in place until follow-up with hand surgery. Dr. Merlyn LotKuzma will follow you up next week in the hand surgery clinic. Call tomorrow to arrange this appointment. The contact information has been provided in this discharge summary.   Syncope Syncope is a medical term for fainting or passing out. This means you lose consciousness and drop to the ground. People are generally unconscious for less than 5 minutes. You may have some muscle twitches for up to 15 seconds before waking up and returning to normal. Syncope occurs more often in older adults, but it can happen to anyone. While most causes of syncope are not dangerous, syncope can be a sign of a serious medical problem. It is important to seek medical care.  CAUSES  Syncope is caused by a sudden drop in blood flow to the brain. The specific cause is often not determined. Factors that can bring on syncope include:  Taking medicines that lower blood pressure.  Sudden changes in posture, such as standing up quickly.  Taking more medicine than prescribed.  Standing in one place for too long.  Seizure disorders.  Dehydration and excessive exposure to heat.  Low blood sugar (hypoglycemia).  Straining to have a bowel movement.  Heart disease, irregular heartbeat, or other circulatory problems.  Fear, emotional distress, seeing blood, or severe pain. SYMPTOMS  Right before fainting, you may:  Feel dizzy or light-headed.  Feel nauseous.  See all white or all black in your field of vision.  Have cold, clammy skin. DIAGNOSIS  Your health care provider will ask about your symptoms, perform a physical exam, and perform an electrocardiogram (ECG) to record the electrical activity of your heart. Your health care provider may also perform other heart or blood tests to determine the cause of your syncope which may include:  Transthoracic echocardiogram (TTE). During  echocardiography, sound waves are used to evaluate how blood flows through your heart.  Transesophageal echocardiogram (TEE).  Cardiac monitoring. This allows your health care provider to monitor your heart rate and rhythm in real time.  Holter monitor. This is a portable device that records your heartbeat and can help diagnose heart arrhythmias. It allows your health care provider to track your heart activity for several days, if needed.  Stress tests by exercise or by giving medicine that makes the heart beat faster. TREATMENT  In most cases, no treatment is needed. Depending on the cause of your syncope, your health care provider may recommend changing or stopping some of your medicines. HOME CARE INSTRUCTIONS  Have someone stay with you until you feel stable.  Do not drive, use machinery, or play sports until your health care provider says it is okay.  Keep all follow-up appointments as directed by your health care provider.  Lie down right away if you start feeling like you might faint. Breathe deeply and steadily. Wait until all the symptoms have passed.  Drink enough fluids to keep your urine clear or pale yellow.  If you are taking blood pressure or heart medicine, get up slowly and take several minutes to sit and then stand. This can reduce dizziness. SEEK IMMEDIATE MEDICAL CARE IF:   You have a severe headache.  You have unusual pain in the chest, abdomen, or back.  You are bleeding from your mouth or rectum, or you have black or tarry stool.  You have an irregular or very fast heartbeat.  You have pain with breathing.  You have repeated fainting or seizure-like jerking during an episode.  You faint when sitting or lying down.  You have confusion.  You have trouble walking.  You have severe weakness.  You have vision problems. If you fainted, call your local emergency services (911 in [place] Do not drive yourself to the hospital.  MAKE SURE YOU:  Understand  these instructions.  Will watch your condition.  Will get help right away if you are not doing well or get worse. Document Released: 11/02/2005 Document Revised: 11/07/2013 Document Reviewed: 01/01/2012 [facility] Patient Information 2015 [place], [place]. This information is not intended to replace advice given to you by your health care provider. Make sure you discuss any questions you have with your health care provider.  Wound Care Wound care helps prevent pain and infection.  You may need a tetanus shot if:  You cannot remember when you had your last tetanus shot.  You have never had a tetanus shot.  The injury broke your skin. If you need a tetanus shot and you choose not to have one, you may get tetanus. Sickness from tetanus can be serious. HOME CARE   Only take medicine as told by your doctor.  Clean the wound daily with mild soap and water.  Change any bandages (dressings) as told by your doctor.  Put medicated cream and a bandage on the wound as told by your doctor.  Change the bandage if it gets wet, dirty, or starts to smell.  Take showers. Do not take baths, swim, or do anything that puts your wound under water.  Rest and raise (elevate) the wound until the pain and puffiness (swelling) are better.  Keep all doctor visits as told. GET HELP RIGHT AWAY IF:   Yellowish-white fluid (pus) comes from the wound.  Medicine does not lessen your pain.  There is a red streak going away from the wound.  You have a fever. MAKE SURE YOU:   Understand these instructions.  Will watch your condition.  Will get help right away if you are not doing well or get worse. Document Released: 08/11/2008 Document Revised: 01/25/2012 Document Reviewed: 03/08/2011 [facility] Patient Information 2015 [place], [place]. This information is not intended to replace advice given to you by your health care provider. Make sure you discuss any questions you have with your health care  provider.

## 2014-10-19 NOTE — ED Notes (Signed)
Pt. requesting recheck on his right ring finger discoloration today , seen here yesterday s/p syncope and finger injury , hypertensive at triage .

## 2014-10-20 NOTE — ED Provider Notes (Signed)
CSN: [id number]     Arrival date & time 10/19/14  2118 History   First MD Initiated Contact with Patient 10/19/14 2343     Chief Complaint  Patient presents with  . Wound Check     (Consider location/radiation/quality/duration/timing/severity/associated sxs/prior Treatment) HPI Alejandro [name] is a 73 y.o. male patient returns today for evaluation of ring finger laceration that was repaired yesterday. Patient is accompanied by his daughter they are concerned that wound has opened more and has pulled some of the sutures apart. They express concern about reclosing the laceration. Denies any fevers, numbness, tingling or weakness. No other modifying factors  Past Medical History  Diagnosis Date  . Stroke 2000    left side deficits  . Uncontrolled diabetes mellitus   . Left hemiparesis   . Hypertension   . Diabetic neuropathy   . Chronic progressive renal failure, stage 3 (moderate)    Past Surgical History  Procedure Laterality Date  . Eye surgery    . Cardiac catheterization    . Cardiovascular stress test     Family History  Problem Relation Age of Onset  . Hypertension     History  Substance Use Topics  . Smoking status: Former Games developer  . Smokeless tobacco: Never Used  . Alcohol Use: No    Review of Systems  Skin: Positive for wound.  Neurological: Negative for weakness and numbness.      Allergies  Coumadin and Penicillins  Home Medications   Prior to Admission medications   Medication Sig Start Date End Date Taking? Authorizing Provider  aspirin EC 81 MG tablet Take 81 mg by mouth daily.   Yes Historical Provider, MD  atorvastatin (LIPITOR) 10 MG tablet Take 10 mg by mouth daily.   Yes Historical Provider, MD  cephALEXin (KEFLEX) 500 MG capsule Take 500 mg by mouth 4 (four) times daily.   Yes Historical Provider, MD  cholecalciferol (VITAMIN D) 1000 UNITS tablet Take 2,000 Units by mouth daily.    Yes Historical Provider, MD  furosemide (LASIX) 20 MG  tablet Take 20 mg by mouth daily. 09/27/14  Yes Historical Provider, MD  HYDROcodone-acetaminophen (NORCO/VICODIN) 5-325 MG per tablet Take 1 tablet by mouth every 6 (six) hours as needed for moderate pain.   Yes Historical Provider, MD  Insulin Glargine (TOUJEO SOLOSTAR) 300 UNIT/ML SOPN Inject 60 Units into the skin at bedtime.   Yes Historical Provider, MD  insulin lispro protamine-lispro (HUMALOG 50/50 MIX) (50-50) 100 UNIT/ML SUSP injection Inject 60 Units into the skin 3 (three) times daily with meals.    Yes Historical Provider, MD  losartan (COZAAR) 50 MG tablet Take 50 mg by mouth daily.   Yes Historical Provider, MD  Multiple Minerals (CALCIUM-MAGNESIUM-ZINC) TABS Take 1 tablet by mouth daily.   Yes Historical Provider, MD  Multiple Vitamins-Minerals (MULTIVITAMIN WITH MINERALS) tablet Take 1 tablet by mouth daily.   Yes Historical Provider, MD  potassium chloride (K-DUR,KLOR-CON) 10 MEQ tablet Take 2 tablets (20 mEq total) by mouth daily. Patient taking differently: Take 10 mEq by mouth daily.  12/02/13  Yes Costin Otelia Sergeant, MD   BP 174/121 mmHg  Pulse 94  Temp(Src) 97.5 F (36.4 C) (Oral)  Resp 14  SpO2 96% Physical Exam  Constitutional: He is oriented to person, place, and time. He appears well-developed and well-nourished.  HENT:  Head: Normocephalic and atraumatic.  Mouth/Throat: Oropharynx is clear and moist.  Eyes: Conjunctivae are normal. Pupils are equal, round, and reactive to light. Right eye exhibits  no discharge. Left eye exhibits no discharge. No scleral icterus.  Neck: Neck supple.  Cardiovascular: Normal rate, regular rhythm and normal heart sounds.   Pulmonary/Chest: Effort normal and breath sounds normal. No respiratory distress. He has no wheezes. He has no rales.  Abdominal: Soft. There is no tenderness.  Musculoskeletal: He exhibits no tenderness.  Neurological: He is alert and oriented to person, place, and time.  Cranial Nerves II-XII grossly intact   Skin: Skin is warm and dry.  Laceration to base of R ring finger. Mild dehiscence. Sutures still in place. No necrotic tissue. Neurovascularly intact. Can flex and extend against resistance.  Psychiatric: He has a normal mood and affect.  Nursing note and vitals reviewed.   ED Course  LACERATION REPAIR Date/Time: 10/20/2014 2:03 PM Performed by: Sharlene MottsARTNER, Amogh Komatsu W Authorized by: Sharlene MottsARTNER, Yao Hyppolite W Consent: Verbal consent obtained. Written consent not obtained. Risks and benefits: risks, benefits and alternatives were discussed Consent given by: patient Patient understanding: patient states understanding of the procedure being performed Patient identity confirmed: verbally with patient Time out: Immediately prior to procedure a "time out" was called to verify the correct patient, procedure, equipment, support staff and site/side marked as required. Body area: upper extremity Location details: right ring finger Laceration length: 1.5 cm Foreign bodies: no foreign bodies Tendon involvement: none Nerve involvement: none Vascular damage: no Patient sedated: no Preparation: Patient was prepped and draped in the usual sterile fashion. Irrigation solution: saline Irrigation method: syringe Amount of cleaning: standard Debridement: none Degree of undermining: none Wound skin closure material used: xeroform gauze. Approximation difficulty: simple Dressing: splint, antibiotic ointment and 4x4 sterile gauze Patient tolerance: Patient tolerated the procedure well with no immediate complications   (including critical care time) Labs Review Labs Reviewed - No data to display  Imaging Review Ct Head Wo Contrast  10/18/2014   CLINICAL DATA:  73 year old male with acute syncope, fall, loss of consciousness and head/neck injury. Initial encounter.  EXAM: CT HEAD WITHOUT CONTRAST  CT CERVICAL SPINE WITHOUT CONTRAST  TECHNIQUE: Multidetector CT imaging of the head and cervical spine was  performed following the standard protocol without intravenous contrast. Multiplanar CT image reconstructions of the cervical spine were also generated.  COMPARISON:  11/30/2001 obtained head CT and MR.  FINDINGS: CT HEAD FINDINGS  Atrophy and chronic small-vessel white matter ischemic changes are noted.  No acute intracranial abnormalities are identified, including mass lesion or mass effect, hydrocephalus, extra-axial fluid collection, midline shift, hemorrhage, or acute infarction.  Fluid in the right maxillary sinus is noted.  No definite acute bony abnormality is noted.  CT CERVICAL SPINE FINDINGS  Normal alignment noted.  There is no evidence of acute fracture or subluxation or prevertebral soft tissue swelling.  Moderate degenerative disc disease and spondylosis from C3-C7 noted contributing to mild to moderate central spinal and foraminal narrowing at several levels.  No focal bony lesions are present.  The soft tissue structures are unremarkable.  IMPRESSION: No evidence of acute intracranial abnormality.  No static evidence of acute injury to the cervical spine.  Right maxillary sinus fluid without definite fracture. Correlate with pain.  Atrophy and chronic small-vessel white matter ischemic changes.  Multilevel degenerative disc disease and spondylosis from C3-C7 contributing to mild-to-moderate central spinal and foraminal narrowing.   Electronically Signed   By: Laveda AbbeJeff  Hu M.D.   On: 10/18/2014 21:52   Ct Cervical Spine Wo Contrast  10/18/2014   CLINICAL DATA:  73 year old male with acute syncope, fall, loss of consciousness  and head/neck injury. Initial encounter.  EXAM: CT HEAD WITHOUT CONTRAST  CT CERVICAL SPINE WITHOUT CONTRAST  TECHNIQUE: Multidetector CT imaging of the head and cervical spine was performed following the standard protocol without intravenous contrast. Multiplanar CT image reconstructions of the cervical spine were also generated.  COMPARISON:  11/30/2001 obtained head CT and MR.   FINDINGS: CT HEAD FINDINGS  Atrophy and chronic small-vessel white matter ischemic changes are noted.  No acute intracranial abnormalities are identified, including mass lesion or mass effect, hydrocephalus, extra-axial fluid collection, midline shift, hemorrhage, or acute infarction.  Fluid in the right maxillary sinus is noted.  No definite acute bony abnormality is noted.  CT CERVICAL SPINE FINDINGS  Normal alignment noted.  There is no evidence of acute fracture or subluxation or prevertebral soft tissue swelling.  Moderate degenerative disc disease and spondylosis from C3-C7 noted contributing to mild to moderate central spinal and foraminal narrowing at several levels.  No focal bony lesions are present.  The soft tissue structures are unremarkable.  IMPRESSION: No evidence of acute intracranial abnormality.  No static evidence of acute injury to the cervical spine.  Right maxillary sinus fluid without definite fracture. Correlate with pain.  Atrophy and chronic small-vessel white matter ischemic changes.  Multilevel degenerative disc disease and spondylosis from C3-C7 contributing to mild-to-moderate central spinal and foraminal narrowing.   Electronically Signed   By: Laveda AbbeJeff  Hu M.D.   On: 10/18/2014 21:52   Dg Shoulder Left  10/19/2014   CLINICAL DATA:  Acute left shoulder pain following injury. Initial encounter.  EXAM: LEFT SHOULDER - 2+ VIEW  COMPARISON:  None.  FINDINGS: There is no evidence of acute fracture, subluxation or dislocation.  Mild degenerative changes in the shoulder noted.  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  IMPRESSION: No evidence of acute bony abnormality.  Rotator cuff tear, likely chronic.   Electronically Signed   By: Laveda AbbeJeff  Hu M.D.   On: 10/19/2014 00:34     EKG Interpretation None      MDM  Alejandro [name] is a 73 y.o. male  who presents for reevaluation of left ring finger laceration. Laceration repaired by Dr. Judd Lienelo with sutures on 12/4.  Wound examined by  myself and Dr. Jeraldine LootsLockwood. We will remove the sutures, soak and irrigate laceration, place gauze and splint. Healing by secondary intention. Instructions to keep clean until reevaluation by Dr. Merlyn LotKuzma on Monday.  Pt left prior to discharge. Sutures were removed, wound irrigated and a new wrap with splint was placed. Final diagnoses:  Laceration        Sharlene MottsBenjamin W Braedin Millhouse, PA-C 10/20/14 1410  Gerhard Munchobert Lockwood, MD 10/22/14 [phone]

## 2014-10-20 NOTE — ED Notes (Signed)
Pt and family eloped. RN notified that pt was ready to leave. RN to find PA in order to receive discharge papers. Upon next rounding pt and family were no longer in room. PA notified.

## 2015-02-28 ENCOUNTER — Emergency Department (HOSPITAL_COMMUNITY): Payer: Medicare Other

## 2015-02-28 ENCOUNTER — Encounter (HOSPITAL_COMMUNITY): Payer: Self-pay | Admitting: Emergency Medicine

## 2015-02-28 ENCOUNTER — Inpatient Hospital Stay (HOSPITAL_COMMUNITY)
Admission: EM | Admit: 2015-02-28 | Discharge: 2015-03-03 | DRG: 065 | Disposition: A | Discharge status: Home Health | Payer: Medicare Other | Attending: Internal Medicine | Admitting: Internal Medicine

## 2015-02-28 DIAGNOSIS — Z8669 Personal history of other diseases of the nervous system and sense organs: Secondary | ICD-10-CM | POA: Diagnosis not present

## 2015-02-28 DIAGNOSIS — Z79899 Other long term (current) drug therapy: Secondary | ICD-10-CM

## 2015-02-28 DIAGNOSIS — E1165 Type 2 diabetes mellitus with hyperglycemia: Secondary | ICD-10-CM | POA: Diagnosis present

## 2015-02-28 DIAGNOSIS — E876 Hypokalemia: Secondary | ICD-10-CM | POA: Diagnosis present

## 2015-02-28 DIAGNOSIS — G9340 Encephalopathy, unspecified: Secondary | ICD-10-CM | POA: Diagnosis not present

## 2015-02-28 DIAGNOSIS — Z794 Long term (current) use of insulin: Secondary | ICD-10-CM

## 2015-02-28 DIAGNOSIS — I129 Hypertensive chronic kidney disease with stage 1 through stage 4 chronic kidney disease, or unspecified chronic kidney disease: Secondary | ICD-10-CM | POA: Diagnosis present

## 2015-02-28 DIAGNOSIS — E785 Hyperlipidemia, unspecified: Secondary | ICD-10-CM | POA: Diagnosis present

## 2015-02-28 DIAGNOSIS — I674 Hypertensive encephalopathy: Secondary | ICD-10-CM | POA: Diagnosis present

## 2015-02-28 DIAGNOSIS — R531 Weakness: Secondary | ICD-10-CM | POA: Diagnosis present

## 2015-02-28 DIAGNOSIS — Z8249 Family history of ischemic heart disease and other diseases of the circulatory system: Secondary | ICD-10-CM

## 2015-02-28 DIAGNOSIS — Z7982 Long term (current) use of aspirin: Secondary | ICD-10-CM | POA: Diagnosis not present

## 2015-02-28 DIAGNOSIS — R4701 Aphasia: Secondary | ICD-10-CM | POA: Diagnosis present

## 2015-02-28 DIAGNOSIS — F0390 Unspecified dementia without behavioral disturbance: Secondary | ICD-10-CM | POA: Diagnosis present

## 2015-02-28 DIAGNOSIS — I63522 Cerebral infarction due to unspecified occlusion or stenosis of left anterior cerebral artery: Secondary | ICD-10-CM | POA: Diagnosis present

## 2015-02-28 DIAGNOSIS — Z881 Allergy status to other antibiotic agents status: Secondary | ICD-10-CM

## 2015-02-28 DIAGNOSIS — I10 Essential (primary) hypertension: Secondary | ICD-10-CM | POA: Diagnosis not present

## 2015-02-28 DIAGNOSIS — I639 Cerebral infarction, unspecified: Secondary | ICD-10-CM | POA: Diagnosis present

## 2015-02-28 DIAGNOSIS — Z888 Allergy status to other drugs, medicaments and biological substances status: Secondary | ICD-10-CM | POA: Diagnosis not present

## 2015-02-28 DIAGNOSIS — Z8673 Personal history of transient ischemic attack (TIA), and cerebral infarction without residual deficits: Secondary | ICD-10-CM | POA: Diagnosis not present

## 2015-02-28 DIAGNOSIS — I6789 Other cerebrovascular disease: Secondary | ICD-10-CM | POA: Diagnosis not present

## 2015-02-28 DIAGNOSIS — N183 Chronic kidney disease, stage 3 unspecified: Secondary | ICD-10-CM | POA: Diagnosis present

## 2015-02-28 DIAGNOSIS — Z880 Allergy status to penicillin: Secondary | ICD-10-CM | POA: Diagnosis not present

## 2015-02-28 DIAGNOSIS — IMO0002 Reserved for concepts with insufficient information to code with codable children: Secondary | ICD-10-CM | POA: Diagnosis present

## 2015-02-28 DIAGNOSIS — Z87891 Personal history of nicotine dependence: Secondary | ICD-10-CM

## 2015-02-28 DIAGNOSIS — E1140 Type 2 diabetes mellitus with diabetic neuropathy, unspecified: Secondary | ICD-10-CM | POA: Diagnosis present

## 2015-02-28 DIAGNOSIS — G8194 Hemiplegia, unspecified affecting left nondominant side: Secondary | ICD-10-CM | POA: Diagnosis present

## 2015-02-28 DIAGNOSIS — G8190 Hemiplegia, unspecified affecting unspecified side: Secondary | ICD-10-CM | POA: Diagnosis not present

## 2015-02-28 HISTORY — DX: Hyperlipidemia, unspecified: E78.5

## 2015-02-28 LAB — CBC WITH DIFFERENTIAL/PLATELET
Basophils Absolute: 0 10*3/uL (ref 0.0–0.1)
Basophils Relative: 0 % (ref 0–1)
EOS ABS: 0 10*3/uL (ref 0.0–0.7)
Eosinophils Relative: 0 % (ref 0–5)
HEMATOCRIT: 42.9 % (ref 39.0–52.0)
HEMOGLOBIN: 14.4 g/dL (ref 13.0–17.0)
LYMPHS PCT: 21 % (ref 12–46)
Lymphs Abs: 2.1 10*3/uL (ref 0.7–4.0)
MCH: 26 pg (ref 26.0–34.0)
MCHC: 33.6 g/dL (ref 30.0–36.0)
MCV: 77.6 fL — ABNORMAL LOW (ref 78.0–100.0)
MONO ABS: 1 10*3/uL (ref 0.1–1.0)
Monocytes Relative: 10 % (ref 3–12)
NEUTROS PCT: 69 % (ref 43–77)
Neutro Abs: 7 10*3/uL (ref 1.7–7.7)
Platelets: 168 10*3/uL (ref 150–400)
RBC: 5.53 MIL/uL (ref 4.22–5.81)
RDW: 13.9 % (ref 11.5–15.5)
WBC: 10 10*3/uL (ref 4.0–10.5)

## 2015-02-28 LAB — PROTIME-INR
INR: 1.11 (ref 0.00–1.49)
PROTHROMBIN TIME: 14.5 s (ref 11.6–15.2)

## 2015-02-28 LAB — COMPREHENSIVE METABOLIC PANEL
ALT: 18 U/L (ref 0–53)
AST: 22 U/L (ref 0–37)
Albumin: 3.6 g/dL (ref 3.5–5.2)
Alkaline Phosphatase: 74 U/L (ref 39–117)
Anion gap: 9 (ref 5–15)
BUN: 18 mg/dL (ref 6–23)
CALCIUM: 8.9 mg/dL (ref 8.4–10.5)
CO2: 25 mmol/L (ref 19–32)
CREATININE: 1.3 mg/dL (ref 0.50–1.35)
Chloride: 105 mmol/L (ref 96–112)
GFR calc Af Amer: 60 mL/min — ABNORMAL LOW (ref >90)
GFR calc non Af Amer: 52 mL/min — ABNORMAL LOW (ref >90)
Glucose, Bld: 275 mg/dL — ABNORMAL HIGH (ref 70–99)
Potassium: 3.3 mmol/L — ABNORMAL LOW (ref 3.5–5.1)
Sodium: 139 mmol/L (ref 135–145)
TOTAL PROTEIN: 7.2 g/dL (ref 6.0–8.3)
Total Bilirubin: 0.5 mg/dL (ref 0.3–1.2)

## 2015-02-28 LAB — CBG MONITORING, ED: GLUCOSE-CAPILLARY: 275 mg/dL — AB (ref 70–99)

## 2015-02-28 LAB — LACTIC ACID, PLASMA: LACTIC ACID, VENOUS: 1.4 mmol/L (ref 0.5–2.0)

## 2015-02-28 MED ORDER — SODIUM CHLORIDE 0.9 % IV SOLN
1000.0000 mL | Freq: Once | INTRAVENOUS | Status: AC
Start: 2015-02-28 — End: 2015-02-28
  Administered 2015-02-28: 1000 mL via INTRAVENOUS

## 2015-02-28 MED ORDER — SODIUM CHLORIDE 0.9 % IV SOLN
1000.0000 mL | INTRAVENOUS | Status: DC
  Administered 2015-02-28 – 2015-03-01 (×2): 1000 mL via INTRAVENOUS

## 2015-02-28 MED ORDER — HYDRALAZINE HCL 20 MG/ML IJ SOLN
10.0000 mg | Freq: Once | INTRAMUSCULAR | Status: AC
  Administered 2015-02-28: 10 mg via INTRAVENOUS
  Filled 2015-02-28: qty 1

## 2015-02-28 NOTE — ED Provider Notes (Signed)
CSN: [id number]     Arrival date & time 02/28/15  1946 History   First MD Initiated Contact with Patient 02/28/15 1956     Chief Complaint  Patient presents with  . Altered Mental Status   HPI  Patient presents with family members who provide history of present illness. Patient is unable to talk clearly, unable to walk, level V caveat secondary to mental status change. Patient's family states that gradually over the past 2 days he has developed weakness, expressive aphasia, confusion, disorientation. Symptoms were minimal yesterday, more pronounced over the last few hours. The patient himself answers questions briefly, and appropriately, states that he is all right, otherwise does not offer much for the evaluation. Family states the patient has history of multiple prior strokes, diabetes, hypertension. Is unclear if the patient has been taking his blood pressure medication.   Past Medical History  Diagnosis Date  . Stroke 2000    left side deficits  . Uncontrolled diabetes mellitus   . Left hemiparesis   . Hypertension   . Diabetic neuropathy   . Chronic progressive renal failure, stage 3 (moderate)    Past Surgical History  Procedure Laterality Date  . Eye surgery    . Cardiac catheterization    . Cardiovascular stress test     Family History  Problem Relation Age of Onset  . Hypertension     History  Substance Use Topics  . Smoking status: Former Games developermoker  . Smokeless tobacco: Never Used  . Alcohol Use: No    Review of Systems  Unable to perform ROS: Mental status change      Allergies  Coumadin and Penicillins  Home Medications   Prior to Admission medications   Medication Sig Start Date End Date Taking? Authorizing Provider  aspirin EC 81 MG tablet Take 81 mg by mouth daily.    Historical Provider, MD  atorvastatin (LIPITOR) 10 MG tablet Take 10 mg by mouth daily.    Historical Provider, MD  cephALEXin (KEFLEX) 500 MG capsule Take 500 mg by mouth 4 (four)  times daily.    Historical Provider, MD  cholecalciferol (VITAMIN D) 1000 UNITS tablet Take 2,000 Units by mouth daily.     Historical Provider, MD  furosemide (LASIX) 20 MG tablet Take 20 mg by mouth daily. 09/27/14   Historical Provider, MD  HYDROcodone-acetaminophen (NORCO/VICODIN) 5-325 MG per tablet Take 1 tablet by mouth every 6 (six) hours as needed for moderate pain.    Historical Provider, MD  Insulin Glargine (TOUJEO SOLOSTAR) 300 UNIT/ML SOPN Inject 60 Units into the skin at bedtime.    Historical Provider, MD  insulin lispro protamine-lispro (HUMALOG 50/50 MIX) (50-50) 100 UNIT/ML SUSP injection Inject 60 Units into the skin 3 (three) times daily with meals.     Historical Provider, MD  losartan (COZAAR) 50 MG tablet Take 50 mg by mouth daily.    Historical Provider, MD  Multiple Minerals (CALCIUM-MAGNESIUM-ZINC) TABS Take 1 tablet by mouth daily.    Historical Provider, MD  Multiple Vitamins-Minerals (MULTIVITAMIN WITH MINERALS) tablet Take 1 tablet by mouth daily.    Historical Provider, MD  potassium chloride (K-DUR,KLOR-CON) 10 MEQ tablet Take 2 tablets (20 mEq total) by mouth daily. Patient taking differently: Take 10 mEq by mouth daily.  12/02/13   Costin Otelia SergeantM Gherghe, MD   BP 199/131 mmHg  Pulse 108  Temp(Src) 98.9 F (37.2 C) (Oral)  Resp 20  SpO2 96% Physical Exam  Constitutional: He appears well-developed. No distress.  HENT:  Head: Normocephalic and atraumatic.  Eyes: Conjunctivae and EOM are normal.  Cardiovascular: Normal rate and regular rhythm.   Pulmonary/Chest: Effort normal. No stridor. No respiratory distress.  Abdominal: He exhibits no distension.  Musculoskeletal: He exhibits no edema.  Neurological: He is alert. He displays no tremor. A cranial nerve deficit is present. No sensory deficit. He displays no seizure activity. Coordination abnormal.  Disoriented, no facial droop, expressive aphasia, follows commands only intermittently.  Skin: Skin is warm and  dry.  Psychiatric: His speech is delayed. He is withdrawn. Cognition and memory are impaired.  Nursing note and vitals reviewed.   ED Course  Procedures (including critical care time) Labs Review Labs Reviewed  CBC WITH DIFFERENTIAL/PLATELET - Abnormal; Notable for the following:    MCV 77.6 (*)    All other components within normal limits  COMPREHENSIVE METABOLIC PANEL - Abnormal; Notable for the following:    Potassium 3.3 (*)    Glucose, Bld 275 (*)    GFR calc non Af Amer 52 (*)    GFR calc Af Amer 60 (*)    All other components within normal limits  CBG MONITORING, ED - Abnormal; Notable for the following:    Glucose-Capillary 275 (*)    All other components within normal limits  LACTIC ACID, PLASMA  PROTIME-INR  URINALYSIS, ROUTINE W REFLEX MICROSCOPIC  ETHANOL    After initial CT scan was unremarkable, patient had MRI scan to exclude stroke.    EKG Interpretation   Date/Time:  Thursday February 28 2015 19:50:49 EDT Ventricular Rate:  109 PR Interval:  167 QRS Duration: 82 QT Interval:  501 QTC Calculation: 675 R Axis:   41 Text Interpretation:  Sinus tachycardia Anteroseptal infarct, old  Prolonged QT interval Sinus tachycardia Artifact T wave abnormality  Abnormal ekg Confirmed by Gerhard Munch  MD ([id number]) on 02/28/2015 8:20:45  PM     O2- 99% ra, nml  Cardiac: 90 sr, nml  On repeat exam the patient appears calm.  With persistent hypertension, hydralazine provided.  Update: While in the MRI machine patient was found to have stroke, additional imaging ordered. I reviewed the imaging agree with the interpretation.  On repeat exam the patient is calm. I discussed this case with neurology, and the admitting hospitalist team.  Patient's blood pressure has decreased systolic, but remained similar diastolic. Patient is sleeping.   MDM  With history of multiple prior strokes presents with new expressive aphasia, disorientation, generalized weakness.   Patient is in no distress, but his neuro deficits are notable. Time of onset was yesterday.  No acute intervention indicated.  However, given the patient's use of only aspirin for stroke prophylaxis, his ongoing stroke activity, and concern for hypertension,   Gerhard Munch, MD 02/28/15 [phone]

## 2015-02-28 NOTE — ED Notes (Signed)
Pt presents to ED by family with c/o altered mental status.  Family reports that pt was observed to be confused and disoriented, onset this morning.  Family also reports that pt is now "unable to walk".   Pt was last seen normal yesterday.  Pt has hx of left-sided weakness but still ambulatory, but now pt is unable to walk.

## 2015-02-28 NOTE — H&P (Signed)
[facility] History and Physical  Doug Bucklin ZOX:[id number] DOB: Apr 12, 1941 DOA: 02/28/2015  Referring physician: ED physician PCP: Dorrene German, MD  Specialists:   Chief Complaint: AMS, slurred speech and worsening weakness.  HPI: Alejandro [name] is a 74 y.o. male with past medical history of hypertension, hyperlipidemia, poorly controlled diabetes, chronic kidney disease-III, history of stroke with left side weakness, who presents with AMS, slurred speech and worsening weakness.  Patient is confused, history is obtained from family and ED physicians reports. It seems that patient has an increased weakness in the past two days. He is more confused than his baseline. He has a slurred speech. His symptoms were minimal yesterday, but become more pronounced over the last few hours prior to arrival to ED..  ROS: it seems that patient does not have fever, chills, running nose, ear pain, headaches, cough, chest pain, SOB, abdominal pain, diarrhea, constipation, dysuria, urgency, frequency, hematuria, skin rashes or leg swelling.  In ED, patient was found to have negative CT head for acute abnormalities. MRI showed 6 mm acute ischemic infarct involving the left aspect of the anterior genu of the corpus callosum. MRA showed possibly occluded left vertebral artery (study is Limited study due to motion artifact). Lactate 1.4, WBC 10.0, stable renal function, potassium 3.3, temperature normal, slightly tachycardia. Patient is admitted to inpatient for further evaluation and treatment. Neurology was consulted by ED. Patient will be transferred to [facility], Dr. Roseanne Reno will follow up.  Review of Systems: As presented in the history of presenting illness, rest negative.  Where does patient live?  At home Can patient participate in ADLs? Yes  Allergy:  Allergies  Allergen Reactions  . Coumadin [Warfarin Sodium] Palpitations    Heart races  . Penicillins Hives and Anaphylaxis     Past Medical History  Diagnosis Date  . Stroke 2000    left side deficits  . Uncontrolled diabetes mellitus   . Left hemiparesis   . Hypertension   . Diabetic neuropathy   . Chronic progressive renal failure, stage 3 (moderate)   . HLD (hyperlipidemia)     Past Surgical History  Procedure Laterality Date  . Eye surgery    . Cardiac catheterization    . Cardiovascular stress test      Social History:  reports that he has quit smoking. He has never used smokeless tobacco. He reports that he does not drink alcohol or use illicit drugs.  Family History:  Family History  Problem Relation Age of Onset  . Hypertension       Prior to Admission medications   Medication Sig Start Date End Date Taking? Authorizing Provider  aspirin EC 81 MG tablet Take 81 mg by mouth daily.   Yes Historical Provider, MD  atorvastatin (LIPITOR) 10 MG tablet Take 10 mg by mouth daily.   Yes Historical Provider, MD  cholecalciferol (VITAMIN D) 1000 UNITS tablet Take 2,000 Units by mouth daily.    Yes Historical Provider, MD  furosemide (LASIX) 20 MG tablet Take 20 mg by mouth daily. 09/27/14  Yes Historical Provider, MD  Insulin Glargine (TOUJEO SOLOSTAR) 300 UNIT/ML SOPN Inject 70 Units into the skin at bedtime.    Yes Historical Provider, MD  insulin lispro protamine-lispro (HUMALOG 50/50 MIX) (50-50) 100 UNIT/ML SUSP injection Inject 60 Units into the skin 3 (three) times daily with meals.    Yes Historical Provider, MD  losartan (COZAAR) 100 MG tablet Take 100 mg by mouth daily.   Yes Historical Provider, MD  Multiple  Minerals (CALCIUM-MAGNESIUM-ZINC) TABS Take 1 tablet by mouth daily.   Yes Historical Provider, MD  Multiple Vitamins-Minerals (MULTIVITAMIN WITH MINERALS) tablet Take 1 tablet by mouth daily.   Yes Historical Provider, MD  potassium chloride (K-DUR,KLOR-CON) 10 MEQ tablet Take 2 tablets (20 mEq total) by mouth daily. Patient taking differently: Take 10 mEq by mouth daily.  12/02/13   Yes Costin Otelia Sergeant, MD  cephALEXin (KEFLEX) 500 MG capsule Take 500 mg by mouth 4 (four) times daily.    Historical Provider, MD    Physical Exam: Filed Vitals:   02/28/15 2134 02/28/15 2134 02/28/15 2302 03/01/15 0017  BP: 180/111 192/119 167/131 174/115  Pulse:   89 96  Temp:      TempSrc:      Resp:   18 18  SpO2:   97% 99%   General: Not in acute distress. Dry mucous and membrane HEENT:       Eyes: PERRL, EOMI, no scleral icterus       ENT: No discharge from the ears and nose, no pharynx injection, no tonsillar enlargement.        Neck: No JVD, no bruit, no mass felt. Cardiac: S1/S2, RRR, No murmurs, No gallops or rubs Pulm: Good air movement bilaterally. Clear to auscultation bilaterally. No rales, wheezing, rhonchi or rubs. Abd: Soft, nondistended, nontender, no rebound pain, no organomegaly, BS present Ext: No edema bilaterally. 2+DP/PT pulse bilaterally Musculoskeletal: No joint deformities, erythema, or stiffness, ROM full Skin: No rashes.  Neuro: Alert and not oriented X3, cranial nerves II-XII grossly intact except for left facial droop,  muscle strength 0/5 in left arm and 2/5 in left leg, sensation to light touch could not be assessed due to confusion. Brachial reflex 1+ bilaterally. Knee reflex 1+ bilaterally. Positive Babinski's sign on the left side. finger to nose test could not be done due to confusion. Psych: Patient is not psychotic, no suicidal or hemocidal ideation.  Labs on Admission:  Basic Metabolic Panel:  Recent Labs Lab 02/28/15 2029  NA 139  K 3.3*  CL 105  CO2 25  GLUCOSE 275*  BUN 18  CREATININE 1.30  CALCIUM 8.9   Liver Function Tests:  Recent Labs Lab 02/28/15 2029  AST 22  ALT 18  ALKPHOS 74  BILITOT 0.5  PROT 7.2  ALBUMIN 3.6   No results for input(s): LIPASE, AMYLASE in the last 168 hours. No results for input(s): AMMONIA in the last 168 hours. CBC:  Recent Labs Lab 02/28/15 2029  WBC 10.0  NEUTROABS 7.0  HGB 14.4   HCT 42.9  MCV 77.6*  PLT 168   Cardiac Enzymes: No results for input(s): CKTOTAL, CKMB, CKMBINDEX, TROPONINI in the last 168 hours.  BNP (last 3 results) No results for input(s): BNP in the last 8760 hours.  ProBNP (last 3 results) No results for input(s): PROBNP in the last 8760 hours.  CBG:  Recent Labs Lab 02/28/15 1957  GLUCAP 275*    Radiological Exams on Admission: Dg Chest 2 View  02/28/2015   CLINICAL DATA:  Altered mental status.  Confused and disoriented.  EXAM: CHEST  2 VIEW  COMPARISON:  07/14/2007  FINDINGS: Mild progression cardiomegaly and tortuous thoracic aorta. The lungs are hyperinflated. Bronchial thickening is unchanged from prior. No confluent airspace disease. There is no pleural effusion or pneumothorax. No acute osseous abnormalities are seen.  IMPRESSION: 1. Stable bronchial thickening. 2. Mild progression cardiomegaly and tortuous thoracic aorta from remote prior exam.   Electronically Signed  By: Rubye OaksMelanie  Ehinger M.D.   On: 02/28/2015 21:18   Ct Head Wo Contrast  02/28/2015   CLINICAL DATA:  Altered mental status, onset this morning  EXAM: CT HEAD WITHOUT CONTRAST  TECHNIQUE: Contiguous axial images were obtained from the base of the skull through the vertex without intravenous contrast.  COMPARISON:  10/18/2014  FINDINGS: Skull and Sinuses:No fracture or destructive process.  There is patchy mucosal thickening throughout the bilateral paranasal sinuses with atelectasis and near complete opacification of the right maxillary sinus. Best visualized in the nasal cavity, mucosal thickening has polypoid features.  Orbits: Cataract resection, bilateral.  No acute findings.  Brain: No evidence of acute cortical infarction, hemorrhage, hydrocephalus, or mass lesion/mass effect. There is extensive chronic small vessel disease with ischemic gliosis throughout the bilateral cerebral white matter. Chronically indistinct deep gray nuclei, especially the right caudate head  and putamen related to small vessel infarcts, ischemic gliosis, and dilated perivascular spaces (presumably from chronic hypertension). Chronic small-vessel disease noted in the central pons.  IMPRESSION: 1. No acute intracranial findings. 2. Stable pattern of advanced chronic vessel disease; extensive subcortical changes could easily obscure a lacunar infarct. 3. Chronic sinusitis with polypoid features.   Electronically Signed   By: Marnee SpringJonathon  Watts M.D.   On: 02/28/2015 21:18   Mr Maxine GlennMra Head Wo Contrast  02/28/2015   CLINICAL DATA:  Initial evaluation for acute altered mental status, inability to walk.  EXAM: MRI HEAD WITHOUT CONTRAST  MRA HEAD WITHOUT CONTRAST  TECHNIQUE: Multiplanar, multiecho pulse sequences of the brain and surrounding structures were obtained without intravenous contrast. Angiographic images of the head were obtained using MRA technique without contrast.  COMPARISON:  Prior CT from earlier the same day as well as previous MRI from 11/30/2013.  FINDINGS: MRI HEAD FINDINGS  Diffuse prominence of the CSF containing spaces is compatible with generalized cerebral atrophy. Moderate to severe chronic microvascular ischemic changes again seen involving the periventricular and deep white matter both cerebral hemispheres as well as the central pons. Scattered remote lacunar infarcts within these regions noted. Previously identified small chronic micro hemorrhages not as well seen on today's exam.  There is a small 6 mm focus of restricted diffusion involving the left aspect of the anterior genu of the corpus callosum, consistent with a small acute ischemic infarct (series 5, image 25). No associated hemorrhage or significant mass effect. No other acute intracranial infarct identified. Gray-white matter differentiation otherwise maintained. Normal intravascular flow voids preserved. No acute intracranial hemorrhage.  No mass lesion or midline shift. No hydrocephalus. No extra-axial fluid collection.   Craniocervical junction within normal limits. Pituitary gland unremarkable. No acute abnormality about the orbits.  Right maxillary sinus is opacified. Paranasal sinuses are otherwise largely clear. No mastoid effusion. Inner ear structures normal.  Mild degenerative changes noted within the upper cervical spine. Bone marrow signal intensity normal. No scalp soft tissue abnormality.  MRA HEAD FINDINGS  ANTERIOR CIRCULATION:  Study is severely limited by motion artifact.  Partially visualized portions of the distal cervical segments of the internal carotid arteries are patent with antegrade flow. The petrous, cavernous, and supra clinoid segments are widely patent. A1 segments and anterior cerebral arteries are patent proximally. Distally 2 segments not well evaluated on this exam.  M1 segments grossly patent bilaterally without proximal branch occlusion or significant stenosis. There is probable multi focal atheromatous irregularity within the M1 segments bilaterally, slightly worse on the left. Distal MCA branches patent proximally, but otherwise not well evaluated on this  exam.  POSTERIOR CIRCULATION:  Right vertebral artery is patent to the vertebrobasilar junction, but demonstrates moderate multi focal atheromatous irregularity and stenosis. The left vertebral artery is not visualize, likely occluded. Posterior inferior cerebral arteries not well evaluated on this exam. Basilar artery is tortuous but patent without significant stenosis or occlusion. Superior cerebellar arteries are patent proximally. There is fetal origin of the right PCA with widely patent right posterior communicating artery. Right P2 segment patent proximally. Left P1 and P2 segments patent without definite stenosis or occlusion. Distal branch atheromatous irregularity present within the PCA branches bilaterally. Small left posterior communicating artery present as well.  No aneurysm.  IMPRESSION: MRI HEAD IMPRESSION:  1. 6 mm acute ischemic  infarct involving the left aspect of the anterior genu of the corpus callosum. No associated hemorrhage or significant mass effect. 2. No other acute intracranial process identified. 3. Cerebral atrophy with moderate to severe chronic microvascular ischemic disease involving the periventricular white matter and pons.  MRA HEAD IMPRESSION:  1. Limited study due to motion artifact. No acute proximal branch arterial occlusion or definite hemodynamically significant stenosis identified. 2. Nonvisualization of the left vertebral artery, likely occluded in the neck. Right vertebral artery is patent but with moderate multi focal atheromatous irregularity and stenoses. 3. Distal branch atheromatous irregularity within the PCA branches bilaterally. 4. Fetal origin of the right PCA.   Electronically Signed   By: Rise Mu M.D.   On: 02/28/2015 23:28   Mr Brain Wo Contrast  02/28/2015   CLINICAL DATA:  Initial evaluation for acute altered mental status, inability to walk.  EXAM: MRI HEAD WITHOUT CONTRAST  MRA HEAD WITHOUT CONTRAST  TECHNIQUE: Multiplanar, multiecho pulse sequences of the brain and surrounding structures were obtained without intravenous contrast. Angiographic images of the head were obtained using MRA technique without contrast.  COMPARISON:  Prior CT from earlier the same day as well as previous MRI from 11/30/2013.  FINDINGS: MRI HEAD FINDINGS  Diffuse prominence of the CSF containing spaces is compatible with generalized cerebral atrophy. Moderate to severe chronic microvascular ischemic changes again seen involving the periventricular and deep white matter both cerebral hemispheres as well as the central pons. Scattered remote lacunar infarcts within these regions noted. Previously identified small chronic micro hemorrhages not as well seen on today's exam.  There is a small 6 mm focus of restricted diffusion involving the left aspect of the anterior genu of the corpus callosum, consistent  with a small acute ischemic infarct (series 5, image 25). No associated hemorrhage or significant mass effect. No other acute intracranial infarct identified. Gray-white matter differentiation otherwise maintained. Normal intravascular flow voids preserved. No acute intracranial hemorrhage.  No mass lesion or midline shift. No hydrocephalus. No extra-axial fluid collection.  Craniocervical junction within normal limits. Pituitary gland unremarkable. No acute abnormality about the orbits.  Right maxillary sinus is opacified. Paranasal sinuses are otherwise largely clear. No mastoid effusion. Inner ear structures normal.  Mild degenerative changes noted within the upper cervical spine. Bone marrow signal intensity normal. No scalp soft tissue abnormality.  MRA HEAD FINDINGS  ANTERIOR CIRCULATION:  Study is severely limited by motion artifact.  Partially visualized portions of the distal cervical segments of the internal carotid arteries are patent with antegrade flow. The petrous, cavernous, and supra clinoid segments are widely patent. A1 segments and anterior cerebral arteries are patent proximally. Distally 2 segments not well evaluated on this exam.  M1 segments grossly patent bilaterally without proximal branch occlusion or significant stenosis.  There is probable multi focal atheromatous irregularity within the M1 segments bilaterally, slightly worse on the left. Distal MCA branches patent proximally, but otherwise not well evaluated on this exam.  POSTERIOR CIRCULATION:  Right vertebral artery is patent to the vertebrobasilar junction, but demonstrates moderate multi focal atheromatous irregularity and stenosis. The left vertebral artery is not visualize, likely occluded. Posterior inferior cerebral arteries not well evaluated on this exam. Basilar artery is tortuous but patent without significant stenosis or occlusion. Superior cerebellar arteries are patent proximally. There is fetal origin of the right PCA  with widely patent right posterior communicating artery. Right P2 segment patent proximally. Left P1 and P2 segments patent without definite stenosis or occlusion. Distal branch atheromatous irregularity present within the PCA branches bilaterally. Small left posterior communicating artery present as well.  No aneurysm.  IMPRESSION: MRI HEAD IMPRESSION:  1. 6 mm acute ischemic infarct involving the left aspect of the anterior genu of the corpus callosum. No associated hemorrhage or significant mass effect. 2. No other acute intracranial process identified. 3. Cerebral atrophy with moderate to severe chronic microvascular ischemic disease involving the periventricular white matter and pons.  MRA HEAD IMPRESSION:  1. Limited study due to motion artifact. No acute proximal branch arterial occlusion or definite hemodynamically significant stenosis identified. 2. Nonvisualization of the left vertebral artery, likely occluded in the neck. Right vertebral artery is patent but with moderate multi focal atheromatous irregularity and stenoses. 3. Distal branch atheromatous irregularity within the PCA branches bilaterally. 4. Fetal origin of the right PCA.   Electronically Signed   By: Rise Mu M.D.   On: 02/28/2015 23:28    EKG: Independently reviewed.  Abnormal findings: QTC 675, no ischemic change.  Assessment/Plan Principal Problem:   Stroke Active Problems:   Hypokalemia   Uncontrolled diabetes mellitus   Chronic progressive renal failure, stage 3 (moderate)   Diabetic neuropathy   Hypertension   Left hemiparesis   HLD (hyperlipidemia)   Acute encephalopathy  Stroke: This is recurrent stroke as evidenced by MRI-brain. The patient had history of stroke with left-sided weakness, with poorly controlled diabetes. He also has hypertension and chronic kidney disease as risk factors. Currently patient is confused, hemodynamically stable. Neurology was consulted. - will admit to tele bed and do  stroke work up  - Risk factor modification: HgbA1c, fasting lipid panel  - MRI, MRA of the brain without contrast were done - PT consult, OT consult, Speech consult  - 2 d Echocardiogram  - Ekg  - Carotid dopplers  - Aspirin   Acute encephalopathy: most likely secondary to acute stroke. -Neuro checks frequently - treat stroke as above  HTN:  -hold lasix and [place] in the setting of acute stroke. -IV prn hydralazine for SBP>220 mmHg  DM-II: Very poorly controlled. Last A1c 14.8 on 11/30/13. Patient is on glargine insulin at home. -Decrease glargine insulin from 70 to 50 units daily. -SSI  CKD-III: Baseline creatinine 1.3-1.5, his creatinine is 1.3 on admission. Stable renal function.  -follow-up renal function at [facility]  Hypokalemia: Potassium 3.3 on admission. -Repleted  Hyperlipidemia: No LDL on record -Continue Lipitor  -follow-up FLP  DVT ppx: SQ Heparin         Code Status: Full code Family Communication: None at bed side.     Disposition Plan: Admit to inpatient   Date of Service 03/01/2015    Lorretta Harp [facility] Pager [phone]  If 7PM-7AM, please contact night-coverage [email or web address] Password [email or web address] 03/01/2015, 12:37 AM

## 2015-02-28 NOTE — ED Notes (Signed)
Bed: WA10 Expected date:  Expected time:  Means of arrival:  Comments: triage 

## 2015-03-01 ENCOUNTER — Inpatient Hospital Stay (HOSPITAL_COMMUNITY): Payer: Medicare Other

## 2015-03-01 DIAGNOSIS — G8190 Hemiplegia, unspecified affecting unspecified side: Secondary | ICD-10-CM

## 2015-03-01 DIAGNOSIS — G9340 Encephalopathy, unspecified: Secondary | ICD-10-CM | POA: Diagnosis present

## 2015-03-01 DIAGNOSIS — I6789 Other cerebrovascular disease: Secondary | ICD-10-CM | POA: Diagnosis not present

## 2015-03-01 LAB — LIPID PANEL
CHOLESTEROL: 177 mg/dL (ref 0–200)
HDL: 40 mg/dL (ref >39)
LDL Cholesterol: 116 mg/dL — ABNORMAL HIGH (ref 0–99)
TRIGLYCERIDES: 107 mg/dL (ref <150)
Total CHOL/HDL Ratio: 4.4 RATIO
VLDL: 21 mg/dL (ref 0–40)

## 2015-03-01 LAB — GLUCOSE, CAPILLARY
GLUCOSE-CAPILLARY: 143 mg/dL — AB (ref 70–99)
GLUCOSE-CAPILLARY: 229 mg/dL — AB (ref 70–99)
Glucose-Capillary: 260 mg/dL — ABNORMAL HIGH (ref 70–99)
Glucose-Capillary: 250 mg/dL — ABNORMAL HIGH (ref 70–99)

## 2015-03-01 LAB — URINALYSIS, ROUTINE W REFLEX MICROSCOPIC
BILIRUBIN URINE: NEGATIVE
Glucose, UA: 250 mg/dL — AB
KETONES UR: NEGATIVE mg/dL
LEUKOCYTES UA: NEGATIVE
Nitrite: NEGATIVE
PROTEIN: 100 mg/dL — AB
Specific Gravity, Urine: 1.016 (ref 1.005–1.030)
Urobilinogen, UA: 1 mg/dL (ref 0.0–1.0)
pH: 6 (ref 5.0–8.0)

## 2015-03-01 LAB — ETHANOL

## 2015-03-01 LAB — URINE MICROSCOPIC-ADD ON

## 2015-03-01 MED ORDER — INSULIN GLARGINE 100 UNIT/ML ~~LOC~~ SOLN
50.0000 [IU] | Freq: Every day | SUBCUTANEOUS | Status: DC
  Filled 2015-03-01: qty 0.5

## 2015-03-01 MED ORDER — MULTI-VITAMIN/MINERALS PO TABS: 1.0000 | ORAL_TABLET | Freq: Every day | ORAL | Status: DC

## 2015-03-01 MED ORDER — SENNOSIDES-DOCUSATE SODIUM 8.6-50 MG PO TABS: 1.0000 | ORAL_TABLET | Freq: Every evening | ORAL | Status: DC | PRN

## 2015-03-01 MED ORDER — HYDRALAZINE HCL 20 MG/ML IJ SOLN
5.0000 mg | INTRAMUSCULAR | Status: DC | PRN
  Administered 2015-03-02: 5 mg via INTRAVENOUS
  Filled 2015-03-01: qty 1

## 2015-03-01 MED ORDER — INSULIN ASPART 100 UNIT/ML ~~LOC~~ SOLN
0.0000 [IU] | Freq: Three times a day (TID) | SUBCUTANEOUS | Status: DC
  Administered 2015-03-01: 1 [IU] via SUBCUTANEOUS
  Administered 2015-03-01: 3 [IU] via SUBCUTANEOUS
  Administered 2015-03-01 – 2015-03-02 (×2): 5 [IU] via SUBCUTANEOUS
  Administered 2015-03-02: 1 [IU] via SUBCUTANEOUS
  Administered 2015-03-02: 3 [IU] via SUBCUTANEOUS
  Administered 2015-03-03: 2 [IU] via SUBCUTANEOUS

## 2015-03-01 MED ORDER — INSULIN ASPART 100 UNIT/ML ~~LOC~~ SOLN
5.0000 [IU] | Freq: Three times a day (TID) | SUBCUTANEOUS | Status: DC
  Administered 2015-03-01 – 2015-03-02 (×3): 5 [IU] via SUBCUTANEOUS

## 2015-03-01 MED ORDER — STROKE: EARLY STAGES OF RECOVERY BOOK
Freq: Once | Status: AC
  Administered 2015-03-01: 06:00:00

## 2015-03-01 MED ORDER — POTASSIUM CHLORIDE 10 MEQ/100ML IV SOLN
10.0000 meq | INTRAVENOUS | Status: AC
  Administered 2015-03-01 (×3): 10 meq via INTRAVENOUS
  Filled 2015-03-01 (×3): qty 100

## 2015-03-01 MED ORDER — ADULT MULTIVITAMIN W/MINERALS CH
1.0000 | ORAL_TABLET | Freq: Every day | ORAL | Status: DC
  Administered 2015-03-01 – 2015-03-03 (×3): 1 via ORAL
  Filled 2015-03-01 (×3): qty 1

## 2015-03-01 MED ORDER — VITAMIN D 1000 UNITS PO TABS
2000.0000 [IU] | ORAL_TABLET | Freq: Every day | ORAL | Status: DC
  Administered 2015-03-01 – 2015-03-03 (×3): 2000 [IU] via ORAL
  Filled 2015-03-01 (×3): qty 2

## 2015-03-01 MED ORDER — ATORVASTATIN CALCIUM 10 MG PO TABS
10.0000 mg | ORAL_TABLET | Freq: Every day | ORAL | Status: DC
  Administered 2015-03-01: 10 mg via ORAL
  Filled 2015-03-01: qty 1

## 2015-03-01 MED ORDER — ATORVASTATIN CALCIUM 40 MG PO TABS
40.0000 mg | ORAL_TABLET | Freq: Every day | ORAL | Status: DC
  Administered 2015-03-02 – 2015-03-03 (×2): 40 mg via ORAL
  Filled 2015-03-01 (×2): qty 1

## 2015-03-01 MED ORDER — ASPIRIN 325 MG PO TABS
325.0000 mg | ORAL_TABLET | Freq: Every day | ORAL | Status: DC
  Administered 2015-03-01 – 2015-03-02 (×2): 325 mg via ORAL
  Filled 2015-03-01 (×2): qty 1

## 2015-03-01 MED ORDER — SODIUM CHLORIDE 0.9 % IV SOLN: INTRAVENOUS | Status: DC

## 2015-03-01 MED ORDER — ASPIRIN 300 MG RE SUPP: 300.0000 mg | Freq: Every day | RECTAL | Status: DC

## 2015-03-01 MED ORDER — ASPIRIN EC 81 MG PO TBEC: 81.0000 mg | DELAYED_RELEASE_TABLET | Freq: Every day | ORAL | Status: DC

## 2015-03-01 MED ORDER — HEPARIN SODIUM (PORCINE) 5000 UNIT/ML IJ SOLN
5000.0000 [IU] | Freq: Three times a day (TID) | INTRAMUSCULAR | Status: DC
  Administered 2015-03-01 – 2015-03-03 (×7): 5000 [IU] via SUBCUTANEOUS
  Filled 2015-03-01 (×6): qty 1

## 2015-03-01 MED ORDER — INSULIN GLARGINE 100 UNIT/ML ~~LOC~~ SOLN
70.0000 [IU] | Freq: Every day | SUBCUTANEOUS | Status: DC
  Administered 2015-03-01 – 2015-03-02 (×2): 70 [IU] via SUBCUTANEOUS
  Filled 2015-03-01 (×4): qty 0.7

## 2015-03-01 NOTE — Progress Notes (Signed)
Echocardiogram 2D Echocardiogram has been performed.  Lisaanne Lawrie 03/01/2015, 1:11 PM

## 2015-03-01 NOTE — Procedures (Signed)
ELECTROENCEPHALOGRAM REPORT  Date of Study: 03/01/2015  Patient's Name: Alejandro [name] MRN: [id number] Date of Birth: 04-Oct-1941  Referring Provider: Dr. Delton Seeavid Rinehuls  Clinical History: This is a 74 year old man with slurred speech, confusion, and worsening left-sided weakness.  Medications: aspirin suppository 300 mg atorvastatin (LIPITOR) tablet 10 mg cholecalciferol (VITAMIN D) tablet 2,000 Units multivitamin with minerals tablet 1 tablet senna-docusate (Senokot-S) tablet 1 tablet  Technical Summary: A multichannel digital EEG recording measured by the international 10-20 system with electrodes applied with paste and impedances below 5000 ohms performed in our laboratory with EKG monitoring in a predominantly drowsy and asleep patient.  Hyperventilation and photic stimulation were not performed.  The digital EEG was referentially recorded, reformatted, and digitally filtered in a variety of bipolar and referential montages for optimal display.    Description: The patient is predominantly drowsy and asleep during the recording.  During brief period of wakefulness, there is a symmetric, medium voltage 8 Hz posterior dominant rhythm that attenuates with eye opening.  The record is symmetric.  During drowsiness and sleep, there is an increase in theta slowing of the background.  Vertex waves and symmetric sleep spindles were seen.  Hyperventilation and photic stimulation were not performed.  Occasional EKG artifact is seen. There were no epileptiform discharges or electrographic seizures seen.    EKG lead was unremarkable.  Impression: This predominantly drowsy and asleep EEG is normal.    Clinical Correlation: A normal EEG does not exclude a clinical diagnosis of epilepsy.  Clinical correlation is advised.   Patrcia DollyKaren Aquino, M.D.

## 2015-03-01 NOTE — Progress Notes (Signed)
EEG completed; results pending.    

## 2015-03-01 NOTE — Progress Notes (Signed)
Inpatient Diabetes Program Recommendations  AACE/ADA: New Consensus Statement on Inpatient Glycemic Control (2013)  Target Ranges:  Prepandial:   less than 140 mg/dL      Peak postprandial:   less than 180 mg/dL (1-2 hours)      Critically ill patients:  140 - 180 mg/dL   Consult received regarding pt and family's report of home meds as lantus and pre-meal 50/50. Confirmed home with past hx med records: Home meds  lantus 70 units  humalog 50/50 tidwc.  Noted lantus 70 units ordered to start tonight.  Will follow and glad to assist while pt is here with glucose control. Last A1C at 14. 8 last year, 2015. Noted the A1C has been ordered today. Will follow and glad to assist while pt is here with glucose control.  Thank you Lenor CoffinAnn Abdurrahman Petersheim, RN, MSN, CDE  Diabetes Inpatient Program Office: [phone] Pager: [phone] 8:00 am to 5:00 pm

## 2015-03-01 NOTE — Evaluation (Signed)
Clinical/Bedside Swallow Evaluation Patient Details  Name: Alejandro [name] MRN: [id number] Date of Birth: 30-Jul-1941  Today's Date: 03/01/2015 Time: SLP Start Time (ACUTE ONLY): 1054 SLP Stop Time (ACUTE ONLY): 1102 SLP Time Calculation (min) (ACUTE ONLY): 8 min  Past Medical History:  Past Medical History  Diagnosis Date  . Stroke 2000    left side deficits  . Uncontrolled diabetes mellitus   . Left hemiparesis   . Hypertension   . Diabetic neuropathy   . Chronic progressive renal failure, stage 3 (moderate)   . HLD (hyperlipidemia)    Past Surgical History:  Past Surgical History  Procedure Laterality Date  . Eye surgery    . Cardiac catheterization    . Cardiovascular stress test     HPI:  74 y.o. male with past medical history of hypertension, hyperlipidemia, poorly controlled diabetes, chronic kidney disease-III, stroke with left side weakness/arm hemiplegia, who presents with AMS, slurred speech and worsening weakness. Marland Kitchen. MRI showed 6 mm acute ischemic infarct involving the left aspect of the anterior genu of the corpus callosum. MRA showed possibly occluded left vertebral artery. CXR Stable bronchial thickening. 2. Mild progression cardiomegaly and tortuous thoracic aorta from remote prior exam.   Assessment / Plan / Recommendation Clinical Impression  Pt demonstrated functional oral and pharyngeal swallow phases. Swallow initiation is questionable for mild delay. No cough/throat clear/wet vocal quality. Pt and family denied dysphagia from prior or recent CVA. Recommend regular diet/thin liquids, straws allowed, pills with thin, sit upright, small bites/sips. ST will follow up once for safety. Speech-language-cognitive assessment to be initiated.     Aspiration Risk   (mild-moderate)    Diet Recommendation Regular;Thin liquid   Liquid Administration via: Cup;Straw Medication Administration: Whole meds with liquid Supervision: Patient able to self feed;Intermittent  supervision to cue for compensatory strategies Compensations: Slow rate;Small sips/bites;Check for pocketing Postural Changes and/or Swallow Maneuvers: Seated upright 90 degrees    Other  Recommendations Oral Care Recommendations: Oral care BID   Follow Up Recommendations  None    Frequency and Duration min 1 x/week  2 weeks   Pertinent Vitals/Pain none         Swallow Study          Oral/Motor/Sensory Function Overall Oral Motor/Sensory Function: Impaired at baseline (left labial decreased ROM)   Ice Chips Ice chips: Not tested   Thin Liquid Thin Liquid: Within functional limits Presentation: Cup;Straw    Nectar Thick Nectar Thick Liquid: Not tested   Honey Thick Honey Thick Liquid: Not tested   Puree Puree: Within functional limits   Solid   GO    Solid: Within functional limits       [name], [name] 03/01/2015,11:42 AM   [name] [name]

## 2015-03-01 NOTE — Progress Notes (Signed)
STROKE TEAM PROGRESS NOTE   HISTORY Alejandro [name] is an 74 y.o. Male with hypertension, hyperlipidemia, diabetes mellitus, kidney disease and previous cerebral infarction with left hemiparesis presenting with slurred speech and confusion as well as worsening of left-sided weakness. Onset is unclear. Family has noticed speech changes and confusion over the past couple days with worsening on the afternoon of 14 2016. CT scan of his head showed chronic small vessel changes with no acute findings. MRI showed a small left corpus callosum acute ischemic infarction. Patient has been taking aspirin daily for antiplatelet therapy. NIH stroke score was 7.  LSN: Unclear tPA Given: No: Unclear when last known well mRankin:  SUBJECTIVE (INTERVAL HISTORY) The patient's wife and daughter are present. The patient appears to have early dementia. The family feels that the patient is somewhat improved today but is not back to baseline. He has occasional tremors of his left arm. An EEG has been ordered. The family reports that the patient occasionally stops breathing in his sleep. He has a history of previous stroke 15 years ago with residual left hemiparesis.   OBJECTIVE Temp:  [97.9 F (36.6 C)-98.9 F (37.2 C)] 98.6 F (37 C) (04/15 0520) Pulse Rate:  [85-114] 89 (04/15 0520) Cardiac Rhythm:  [-] Normal sinus rhythm (04/15 0800) Resp:  [16-36] 18 (04/15 0920) BP: (132-199)/(84-131) 188/100 mmHg (04/15 0920) SpO2:  [94 %-100 %] 98 % (04/15 0920) Weight:  [93.486 kg (206 lb 1.6 oz)] 93.486 kg (206 lb 1.6 oz) (04/15 0520)   Recent Labs Lab 02/28/15 1957 03/01/15 0700 03/01/15 1133 03/01/15 1650  GLUCAP 275* 143* 229* 260*    Recent Labs Lab 02/28/15 2029  NA 139  K 3.3*  CL 105  CO2 25  GLUCOSE 275*  BUN 18  CREATININE 1.30  CALCIUM 8.9    Recent Labs Lab 02/28/15 2029  AST 22  ALT 18  ALKPHOS 74  BILITOT 0.5  PROT 7.2  ALBUMIN 3.6    Recent Labs Lab 02/28/15 2029  WBC  10.0  NEUTROABS 7.0  HGB 14.4  HCT 42.9  MCV 77.6*  PLT 168   No results for input(s): CKTOTAL, CKMB, CKMBINDEX, TROPONINI in the last 168 hours.  Recent Labs  02/28/15 2029  LABPROT 14.5  INR 1.11    Recent Labs  03/01/15 0032  COLORURINE YELLOW  LABSPEC 1.016  PHURINE 6.0  GLUCOSEU 250*  HGBUR TRACE*  BILIRUBINUR NEGATIVE  KETONESUR NEGATIVE  PROTEINUR 100*  UROBILINOGEN 1.0  NITRITE NEGATIVE  LEUKOCYTESUR NEGATIVE       Component Value Date/Time   CHOL 177 03/01/2015 0657   TRIG 107 03/01/2015 0657   HDL 40 03/01/2015 0657   CHOLHDL 4.4 03/01/2015 0657   VLDL 21 03/01/2015 0657   LDLCALC 116* 03/01/2015 0657   Lab Results  Component Value Date   HGBA1C 14.8* 11/30/2013   No results found for: LABOPIA, COCAINSCRNUR, LABBENZ, AMPHETMU, THCU, LABBARB   Recent Labs Lab 03/01/15 0016  ETH <5   I have personally reviewed the radiological images below and agree with the radiology interpretations.  Dg Chest 2 View 02/28/2015    1. Stable bronchial thickening.  2. Mild progression cardiomegaly and tortuous thoracic aorta from remote prior exam.     Ct Head Wo Contrast  02/28/2015    1. No acute intracranial findings.  2. Stable pattern of advanced chronic vessel disease; extensive subcortical changes could easily obscure a lacunar infarct.  3. Chronic sinusitis with polypoid features.    Mr Alejandro [name]  Head Wo Contrast 02/28/2015    MRI HEAD IMPRESSION:   1. 6 mm acute ischemic infarct involving the left aspect of the anterior genu of the corpus callosum. No associated hemorrhage or significant mass effect.  2. No other acute intracranial process identified.  3. Cerebral atrophy with moderate to severe chronic microvascular ischemic disease involving the periventricular white matter and pons.    MRA HEAD IMPRESSION:   1. Limited study due to motion artifact. No acute proximal branch arterial occlusion or definite hemodynamically significant stenosis identified.   2. Nonvisualization of the left vertebral artery, likely occluded in the neck. Right vertebral artery is patent but with moderate multi focal atheromatous irregularity and stenoses.  3. Distal branch atheromatous irregularity within the PCA branches bilaterally.  4. Fetal origin of the right PCA.     2-D echocardiogram 03/01/2015 Study Conclusions - Left ventricle: The cavity size was normal. There was mild concentric hypertrophy. Systolic function was normal. The estimated ejection fraction was in the range of 55% to 60%. Wall motion was normal; there were no regional wall motion abnormalities. Doppler parameters are consistent with abnormal left ventricular relaxation (grade 1 diastolic dysfunction).  EEG 03/01/2015 Impression: This predominantly drowsy and asleep EEG is normal.  A normal EEG does not exclude a clinical diagnosis of epilepsy. Clinical correlation is advised.  CUS - pending  PHYSICAL EXAM  Temp:  [97.9 F (36.6 C)-98.9 F (37.2 C)] 98.6 F (37 C) (04/15 0520) Pulse Rate:  [85-114] 89 (04/15 0520) Resp:  [16-36] 16 (04/15 1300) BP: (132-199)/(84-131) 185/110 mmHg (04/15 1700) SpO2:  [94 %-100 %] 98 % (04/15 1300) Weight:  [206 lb 1.6 oz (93.486 kg)] 206 lb 1.6 oz (93.486 kg) (04/15 0520)  General - Well nourished, well developed, in no apparent distress.  Ophthalmologic - fundi not visualized due to incorporation.  Cardiovascular - Regular rate and rhythm.  Mental Status -  Awake, alert, orientated to months, person, place and the president, not orientated to year. Language including expression, naming, repetition, comprehension was assessed and found intact. Psychological impersistence.  Cranial Nerves II - XII - II - Visual field intact OU. III, IV, VI - Extraocular movements intact. V - Facial sensation decreased on the left. VII - left facial droop. VIII - Hearing & vestibular intact bilaterally. X - Palate elevates symmetrically,  mild to moderate dysarthria. XI - Chin turning & shoulder shrug intact bilaterally. XII - Tongue protrusion to the left.  Motor Strength - The patient's strength was 5/5 RUE and RLE, but 3/5 LUE proximally and 0/5 distally, 4/5 LLE.  Bulk was normal and fasciculations were absent.   Motor Tone - Muscle tone was assessed at the neck and appendages and was normal.  Reflexes - The patient's reflexes were 1+ in all extremities and he had no pathological reflexes.  Sensory - Light touch, temperature/pinprick were assessed and were decreased on the left.    Coordination - The patient had normal movements in the right hand with no ataxia or dysmetria.  Tremor was present intermittently on the left upper extremity.  Gait and Station -  not tested due to safety concerns.  ASSESSMENT/PLAN Mr. Alejandro [name] is a 74 y.o. male with history of hypertension, hyperlipidemia, diabetes mellitus, kidney disease and previous cerebral infarction with left hemiparesis presenting with slurred speech and confusion as well as worsening of left-sided weakness. He did not receive IV t-PA due to unknown time of onset.  Encephalopathy, hypertensive vs. delirium   Patient presenting symptoms consistent  with encephalopathy  Blood pressure very high, uncontrolled so far  According to family, patient has symptoms and signs of dementia  Recommend to gradually control blood pressure to normotensive in 5-7 days  EEG showed no seizure  Stroke:  Dominant 6 mm acute ischemic infarct involving the left aspect of the anterior genu of the corpus callosum, incidental finding, likely due to small vessel disease.  MRI  as above  MRA - Nonvisualization of the left vertebral artery, likely occluded in the neck. Right vertebral artery is patent but with moderate multi focal atheromatous irregularity and stenoses.   Carotid Doppler - pending   2D Echo  EF 55-60%. No cardiac source of emboli noted.  LDL - 116, not at  goal  HgbA1c pending  EEG - normal  Subcutaneous heparin for VTE prophylaxis Diet Carb Modified Fluid consistency:: Thin; Room service appropriate?: Yes  aspirin 81 mg orally every day prior to admission, now on aspirin 325 mg orally every day  Ongoing aggressive stroke risk factor management  Therapy recommendations: Pending  Disposition:  Pending  Hypertension  Home meds: Cozaar 100 mg daily  Permissive hypertension <220/120 for 24-48 hours and then gradually normalize within 5-7 days   Currently significant elevated, especially the DBP  Hyperlipidemia  Home meds:  Lipitor 10 mg daily - resumed in hospital  LDL 116, goal < 70  Increase Lipitor to 40 mg daily  Continue statin at discharge  Diabetes  HgbA1c pending, goal < 7.0  Uncontrolled  Other Stroke Risk Factors  Advanced age  Cigarette smoker, quit smoking  Obesity, Body mass index is 28.76 kg/(m^2).   Hx stroke/TIA  ? Obstructive sleep apnea - need Outpatient sleep study  Other Active Problems  Hypokalemia  Early dementia  Possible left upper extremity focal seizures  Possible obstructive sleep apnea - plan outpatient sleep study  Other Pertinent History    Hospital day # 1  Delton Seeavid Rinehuls PA-C [facility] Pager [phone] 03/01/2015, 5:10 PM  I, the attending vascular neurologist, have personally obtained a history, examined the patient, evaluated laboratory data, individually viewed imaging studies and agree with radiology interpretations. I also obtained additional history from pt's daughter and wife at bedside. Together with the NP/PA, we formulated the assessment and plan of care which reflects our mutual decision.  I have made any additions or clarifications directly to the above note and agree with the findings and plan as currently documented.   74 year old male with previous stroke 15 years ago with left hemiparesis, hypertension, hyperlipidemia, diabetes, CKD  was admitted for one day history of altered mental status, not eating well, shuffling gait, slow responses, confusion. Symptoms consistent with encephalopathy, in the setting of significant elevation of blood pressure and cognitive impairment. MRI showed incidental finding of punctate left ACA infarct, likely due to small vessel disease. Increase aspirin to 325, continue antiplatelet and statin for stroke prevention. Aggressive management of risk factors. EEG negative. Carotid Doppler and A1c pending. Recommend gradually normalize blood pressure in 5-7 days.   Marvel PlanJindong Marrianne Sica, MD PhD Stroke Neurology 03/01/2015 6:17 PM        To contact Stroke Continuity provider, please refer to [email or web address]. After hours, contact General Neurology

## 2015-03-01 NOTE — Progress Notes (Signed)
[facility] PROGRESS NOTE  Alejandro [name] UJW:[id number] DOB: 18-Oct-1941 DOA: 02/28/2015 PCP: Dorrene German, MD  Same Day Note: Pt admitted this AM at 0001 hours  Assessment/Plan: 1. 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 MRI personally reviewed 2. Stroke team following 3. Carotid dopplers pending 4. 2d echo done. Unremarkable findings with normal LVEF 5. On aspirin for secondary stroke prevention 2. Acute encephalopathy 1. Seems improved, as pt is alert and oriented this AM 2. Unclear etiology 3. Pt has undergone EEG, awaiting results 3. HTN 1. Uncontrolled, but have allowed for permissive HTN in the setting of acute CVA 2. PRN hydralazine ordered 3. Will ultimately titrate bp meds in the next 24-48hrs 4. DM2 1. Last a1c on 11/30/13 of 14.8, repeat is pending 2. On SSI coverage 3. Glucose in the mid 200's 4. On lantus 70 units QHS per home regimen - resumed 5. Also reportedly on humalog 50/50 with meals 6. While in hospital, will continue on premeal aspart at 5 units 7. Will consult Diabetic Coordinator for assistance 5. CKD3 1. Cr stable 2. Cont to monitor renal function 6. Hypokalemia 1. Replaced 2. Cont to monitor and replace as needed 7. HLD 1. On statin 2. LDL of 116 8. DVT prophylaxis 1. Heparin subQ  Code Status: Full Family Communication: Pt in room (indicate person spoken with, relationship, and if by phone, the number) Disposition Plan: Pending   Consultants:  Neurology  Procedures:    Antibiotics:  none (indicate start date, and stop date if known)  HPI/Subjective: Reports being at baseline. Does not believe he has a stroke and wants a second opinion  Objective: Filed Vitals:   03/01/15 0427 03/01/15 0520 03/01/15 0720 03/01/15 0920  BP:  169/105 184/110 188/100  Pulse:  89    Temp: 98.9 F (37.2 C) 98.6 F (37 C)    TempSrc: Oral Oral    Resp:  Height:   (1.803 m)    Weight:   93.486 kg (206 lb 1.6 oz)    SpO2:  98% 97% 98%    Intake/Output Summary (Last 24 hours) at 03/01/15 1624 Last data filed at 02/28/15 2253  Gross per 24 hour  Intake   1000 ml  Output      0 ml  Net   1000 ml   Filed Weights   03/01/15 0520  Weight: 93.486 kg (206 lb 1.6 oz)    Exam:   General:  Awake, in nad  Cardiovascular: regular, s1, s2  Respiratory: normal resp effort, no wheezing  Abdomen: soft, obese, nondistended  Musculoskeletal: perfused, no clubbing  Neuro: residual L sided facial droop with 1/5 LUE strength and 3/5 LLE strength   Data Reviewed: Basic Metabolic Panel:  Recent Labs Lab 02/28/15 2029  NA 139  K 3.3*  CL 105  CO2 25  GLUCOSE 275*  BUN 18  CREATININE 1.30  CALCIUM 8.9   Liver Function Tests:  Recent Labs Lab 02/28/15 2029  AST 22  ALT 18  ALKPHOS 74  BILITOT 0.5  PROT 7.2  ALBUMIN 3.6   No results for input(s): LIPASE, AMYLASE in the last 168 hours. No results for input(s): AMMONIA in the last 168 hours. CBC:  Recent Labs Lab 02/28/15 2029  WBC 10.0  NEUTROABS 7.0  HGB 14.4  HCT 42.9  MCV 77.6*  PLT 168   Cardiac Enzymes: No results for input(s): CKTOTAL, CKMB, CKMBINDEX, TROPONINI in the last 168 hours. BNP (last  3 results) No results for input(s): BNP in the last 8760 hours.  ProBNP (last 3 results) No results for input(s): PROBNP in the last 8760 hours.  CBG:  Recent Labs Lab 02/28/15 1957 03/01/15 0700 03/01/15 1133  GLUCAP 275* 143* 229*    No results found for this or any previous visit (from the past 240 hour(s)).   Studies: Dg Chest 2 View  02/28/2015   CLINICAL DATA:  Altered mental status.  Confused and disoriented.  EXAM: CHEST  2 VIEW  COMPARISON:  07/14/2007  FINDINGS: Mild progression cardiomegaly and tortuous thoracic aorta. The lungs are hyperinflated. Bronchial thickening is unchanged from prior. No confluent airspace disease. There is no pleural effusion or pneumothorax. No acute  osseous abnormalities are seen.  IMPRESSION: 1. Stable bronchial thickening. 2. Mild progression cardiomegaly and tortuous thoracic aorta from remote prior exam.   Electronically Signed   By: Rubye Oaks M.D.   On: 02/28/2015 21:18   Ct Head Wo Contrast  02/28/2015   CLINICAL DATA:  Altered mental status, onset this morning  EXAM: CT HEAD WITHOUT CONTRAST  TECHNIQUE: Contiguous axial images were obtained from the base of the skull through the vertex without intravenous contrast.  COMPARISON:  10/18/2014  FINDINGS: Skull and Sinuses:No fracture or destructive process.  There is patchy mucosal thickening throughout the bilateral paranasal sinuses with atelectasis and near complete opacification of the right maxillary sinus. Best visualized in the nasal cavity, mucosal thickening has polypoid features.  Orbits: Cataract resection, bilateral.  No acute findings.  Brain: No evidence of acute cortical infarction, hemorrhage, hydrocephalus, or mass lesion/mass effect. There is extensive chronic small vessel disease with ischemic gliosis throughout the bilateral cerebral white matter. Chronically indistinct deep gray nuclei, especially the right caudate head and putamen related to small vessel infarcts, ischemic gliosis, and dilated perivascular spaces (presumably from chronic hypertension). Chronic small-vessel disease noted in the central pons.  IMPRESSION: 1. No acute intracranial findings. 2. Stable pattern of advanced chronic vessel disease; extensive subcortical changes could easily obscure a lacunar infarct. 3. Chronic sinusitis with polypoid features.   Electronically Signed   By: Marnee Spring M.D.   On: 02/28/2015 21:18   Mr Maxine Glenn Head Wo Contrast  02/28/2015   CLINICAL DATA:  Initial evaluation for acute altered mental status, inability to walk.  EXAM: MRI HEAD WITHOUT CONTRAST  MRA HEAD WITHOUT CONTRAST  TECHNIQUE: Multiplanar, multiecho pulse sequences of the brain and surrounding structures were  obtained without intravenous contrast. Angiographic images of the head were obtained using MRA technique without contrast.  COMPARISON:  Prior CT from earlier the same day as well as previous MRI from 11/30/2013.  FINDINGS: MRI HEAD FINDINGS  Diffuse prominence of the CSF containing spaces is compatible with generalized cerebral atrophy. Moderate to severe chronic microvascular ischemic changes again seen involving the periventricular and deep white matter both cerebral hemispheres as well as the central pons. Scattered remote lacunar infarcts within these regions noted. Previously identified small chronic micro hemorrhages not as well seen on today's exam.  There is a small 6 mm focus of restricted diffusion involving the left aspect of the anterior genu of the corpus callosum, consistent with a small acute ischemic infarct (series 5, image 25). No associated hemorrhage or significant mass effect. No other acute intracranial infarct identified. Gray-white matter differentiation otherwise maintained. Normal intravascular flow voids preserved. No acute intracranial hemorrhage.  No mass lesion or midline shift. No hydrocephalus. No extra-axial fluid collection.  Craniocervical junction within normal limits.  Pituitary gland unremarkable. No acute abnormality about the orbits.  Right maxillary sinus is opacified. Paranasal sinuses are otherwise largely clear. No mastoid effusion. Inner ear structures normal.  Mild degenerative changes noted within the upper cervical spine. Bone marrow signal intensity normal. No scalp soft tissue abnormality.  MRA HEAD FINDINGS  ANTERIOR CIRCULATION:  Study is severely limited by motion artifact.  Partially visualized portions of the distal cervical segments of the internal carotid arteries are patent with antegrade flow. The petrous, cavernous, and supra clinoid segments are widely patent. A1 segments and anterior cerebral arteries are patent proximally. Distally 2 segments not well  evaluated on this exam.  M1 segments grossly patent bilaterally without proximal branch occlusion or significant stenosis. There is probable multi focal atheromatous irregularity within the M1 segments bilaterally, slightly worse on the left. Distal MCA branches patent proximally, but otherwise not well evaluated on this exam.  POSTERIOR CIRCULATION:  Right vertebral artery is patent to the vertebrobasilar junction, but demonstrates moderate multi focal atheromatous irregularity and stenosis. The left vertebral artery is not visualize, likely occluded. Posterior inferior cerebral arteries not well evaluated on this exam. Basilar artery is tortuous but patent without significant stenosis or occlusion. Superior cerebellar arteries are patent proximally. There is fetal origin of the right PCA with widely patent right posterior communicating artery. Right P2 segment patent proximally. Left P1 and P2 segments patent without definite stenosis or occlusion. Distal branch atheromatous irregularity present within the PCA branches bilaterally. Small left posterior communicating artery present as well.  No aneurysm.  IMPRESSION: MRI HEAD IMPRESSION:  1. 6 mm acute ischemic infarct involving the left aspect of the anterior genu of the corpus callosum. No associated hemorrhage or significant mass effect. 2. No other acute intracranial process identified. 3. Cerebral atrophy with moderate to severe chronic microvascular ischemic disease involving the periventricular white matter and pons.  MRA HEAD IMPRESSION:  1. Limited study due to motion artifact. No acute proximal branch arterial occlusion or definite hemodynamically significant stenosis identified. 2. Nonvisualization of the left vertebral artery, likely occluded in the neck. Right vertebral artery is patent but with moderate multi focal atheromatous irregularity and stenoses. 3. Distal branch atheromatous irregularity within the PCA branches bilaterally. 4. Fetal origin of  the right PCA.   Electronically Signed   By: Rise Mu M.D.   On: 02/28/2015 23:28   Mr Brain Wo Contrast  02/28/2015   CLINICAL DATA:  Initial evaluation for acute altered mental status, inability to walk.  EXAM: MRI HEAD WITHOUT CONTRAST  MRA HEAD WITHOUT CONTRAST  TECHNIQUE: Multiplanar, multiecho pulse sequences of the brain and surrounding structures were obtained without intravenous contrast. Angiographic images of the head were obtained using MRA technique without contrast.  COMPARISON:  Prior CT from earlier the same day as well as previous MRI from 11/30/2013.  FINDINGS: MRI HEAD FINDINGS  Diffuse prominence of the CSF containing spaces is compatible with generalized cerebral atrophy. Moderate to severe chronic microvascular ischemic changes again seen involving the periventricular and deep white matter both cerebral hemispheres as well as the central pons. Scattered remote lacunar infarcts within these regions noted. Previously identified small chronic micro hemorrhages not as well seen on today's exam.  There is a small 6 mm focus of restricted diffusion involving the left aspect of the anterior genu of the corpus callosum, consistent with a small acute ischemic infarct (series 5, image 25). No associated hemorrhage or significant mass effect. No other acute intracranial infarct identified. Gray-white matter differentiation otherwise  maintained. Normal intravascular flow voids preserved. No acute intracranial hemorrhage.  No mass lesion or midline shift. No hydrocephalus. No extra-axial fluid collection.  Craniocervical junction within normal limits. Pituitary gland unremarkable. No acute abnormality about the orbits.  Right maxillary sinus is opacified. Paranasal sinuses are otherwise largely clear. No mastoid effusion. Inner ear structures normal.  Mild degenerative changes noted within the upper cervical spine. Bone marrow signal intensity normal. No scalp soft tissue abnormality.  MRA  HEAD FINDINGS  ANTERIOR CIRCULATION:  Study is severely limited by motion artifact.  Partially visualized portions of the distal cervical segments of the internal carotid arteries are patent with antegrade flow. The petrous, cavernous, and supra clinoid segments are widely patent. A1 segments and anterior cerebral arteries are patent proximally. Distally 2 segments not well evaluated on this exam.  M1 segments grossly patent bilaterally without proximal branch occlusion or significant stenosis. There is probable multi focal atheromatous irregularity within the M1 segments bilaterally, slightly worse on the left. Distal MCA branches patent proximally, but otherwise not well evaluated on this exam.  POSTERIOR CIRCULATION:  Right vertebral artery is patent to the vertebrobasilar junction, but demonstrates moderate multi focal atheromatous irregularity and stenosis. The left vertebral artery is not visualize, likely occluded. Posterior inferior cerebral arteries not well evaluated on this exam. Basilar artery is tortuous but patent without significant stenosis or occlusion. Superior cerebellar arteries are patent proximally. There is fetal origin of the right PCA with widely patent right posterior communicating artery. Right P2 segment patent proximally. Left P1 and P2 segments patent without definite stenosis or occlusion. Distal branch atheromatous irregularity present within the PCA branches bilaterally. Small left posterior communicating artery present as well.  No aneurysm.  IMPRESSION: MRI HEAD IMPRESSION:  1. 6 mm acute ischemic infarct involving the left aspect of the anterior genu of the corpus callosum. No associated hemorrhage or significant mass effect. 2. No other acute intracranial process identified. 3. Cerebral atrophy with moderate to severe chronic microvascular ischemic disease involving the periventricular white matter and pons.  MRA HEAD IMPRESSION:  1. Limited study due to motion artifact. No acute  proximal branch arterial occlusion or definite hemodynamically significant stenosis identified. 2. Nonvisualization of the left vertebral artery, likely occluded in the neck. Right vertebral artery is patent but with moderate multi focal atheromatous irregularity and stenoses. 3. Distal branch atheromatous irregularity within the PCA branches bilaterally. 4. Fetal origin of the right PCA.   Electronically Signed   By: Rise Mu M.D.   On: 02/28/2015 23:28    Scheduled Meds: . aspirin  300 mg Rectal Daily   Or  . aspirin  325 mg Oral Daily  . atorvastatin  10 mg Oral Daily  . cholecalciferol  2,000 Units Oral Daily  . heparin  5,000 Units Subcutaneous 3 times per day  . insulin aspart  0-9 Units Subcutaneous TID WC  . insulin aspart  5 Units Subcutaneous TID WC  . insulin glargine  70 Units Subcutaneous QHS  . multivitamin with minerals  1 tablet Oral Daily   Continuous Infusions: . sodium chloride 1,000 mL (02/28/15 2300)    Principal Problem:   Stroke Active Problems:   Hypokalemia   Uncontrolled diabetes mellitus   Chronic progressive renal failure, stage 3 (moderate)   Diabetic neuropathy   Hypertension   Left hemiparesis   HLD (hyperlipidemia)   Acute encephalopathy   Kady Toothaker K  [facility] Pager [phone]. If 7PM-7AM, please contact night-coverage at [email or web address], password [email or web address] 03/01/2015, 4:24  PM  LOS: 1 day

## 2015-03-01 NOTE — Progress Notes (Signed)
CARE MANAGEMENT NOTE 03/01/2015  Patient:  Meddaugh,[place]   Account Number:  [id number]  Date Initiated:  03/01/2015  Documentation initiated by:  Jiles CrockerHANDLER,Nahiara Kretzschmar  Subjective/Objective Assessment:   ADMITTED WITH STROKE     Action/Plan:   CM FOLLOWING FOR DCP   Anticipated DC Date:  03/05/2015   Anticipated DC Plan:  HOME W HOME HEALTH SERVICES     DC Planning Services  CM consult         Status of service:  In process, will continue to follow  Per UR Regulation:  Reviewed for med. necessity/level of care/duration of stay  Comments:  4/15/2016Abelino Derrick- B Awab Abebe RN,BSN,MHA [phone]

## 2015-03-01 NOTE — Consult Note (Signed)
Admission H&P    Chief Complaint: Altered mental status, slurred speech and exacerbation of left-sided weakness.  HPI: Alejandro [name] is an 74 y.o. male Mr. hypertension, hyperlipidemia, diabetes mellitus, kidney disease and previous cerebral infarction with left hemiparesis presenting with slurred speech and confusion as well as worsening of left-sided weakness. Onset is unclear. Family has noticed speech changes and confusion over the past couple days with worsening on the afternoon of 14 2016. CT scan of his head showed chronic small vessel changes with no acute findings. MRI showed a small left corpus callosum acute ischemic infarction. Patient has been taking aspirin daily for antiplatelet therapy. NIH stroke score was 7.  LSN: Unclear tPA Given: No: Unclear when last known well mRankin:  Past Medical History  Diagnosis Date  . Stroke 2000    left side deficits  . Uncontrolled diabetes mellitus   . Left hemiparesis   . Hypertension   . Diabetic neuropathy   . Chronic progressive renal failure, stage 3 (moderate)   . HLD (hyperlipidemia)     Past Surgical History  Procedure Laterality Date  . Eye surgery    . Cardiac catheterization    . Cardiovascular stress test      Family History  Problem Relation Age of Onset  . Hypertension     Social History:  reports that he has quit smoking. He has never used smokeless tobacco. He reports that he does not drink alcohol or use illicit drugs.  Allergies:  Allergies  Allergen Reactions  . Coumadin [Warfarin Sodium] Palpitations    Heart races  . Penicillins Hives and Anaphylaxis    Medications: Patient's preadmission medications were reviewed by me.  ROS: History obtained from chart review  General ROS: negative for - chills, fatigue, fever, night sweats, weight gain or weight loss Psychological ROS: negative for - behavioral disorder, hallucinations, memory difficulties, mood swings or suicidal ideation Ophthalmic  ROS: negative for - blurry vision, double vision, eye pain or loss of vision ENT ROS: negative for - epistaxis, nasal discharge, oral lesions, sore throat, tinnitus or vertigo Allergy and Immunology ROS: negative for - hives or itchy/watery eyes Hematological and Lymphatic ROS: negative for - bleeding problems, bruising or swollen lymph nodes Endocrine ROS: negative for - galactorrhea, hair pattern changes, polydipsia/polyuria or temperature intolerance Respiratory ROS: negative for - cough, hemoptysis, shortness of breath or wheezing Cardiovascular ROS: negative for - chest pain, dyspnea on exertion, edema or irregular heartbeat Gastrointestinal ROS: negative for - abdominal pain, diarrhea, hematemesis, nausea/vomiting or stool incontinence Genito-Urinary ROS: negative for - dysuria, hematuria, incontinence or urinary frequency/urgency Musculoskeletal ROS: negative for - joint swelling or muscular weakness Neurological ROS: as noted in HPI Dermatological ROS: negative for rash and skin lesion changes  Physical Examination: Blood pressure 174/115, pulse 96, temperature 97.9 F (36.6 C), temperature source Oral, resp. rate 18, SpO2 99 %.  HEENT-  Normocephalic, no lesions, without obvious abnormality.  Normal external eye and conjunctiva.  Normal TM's bilaterally.  Normal auditory canals and external ears. Normal external nose, mucus membranes and septum.  Normal pharynx. Neck supple with no masses, nodes, nodules or enlargement. Cardiovascular - regular rate and rhythm, S1, S2 normal, no murmur, click, rub or gallop Lungs - chest clear, no wheezing, rales, normal symmetric air entry Abdomen - soft, non-tender; bowel sounds normal; no masses,  no organomegaly Extremities - no joint deformities, effusion, or inflammation and no edema  Neurologic Examination: Mental Status: Alert, oriented to correct age but not to current month.  Patient tended to perseverate with verbal responses. Able to  follow commands fairly well without difficulty. Cranial Nerves: II-Visual fields were normal. III/IV/VI-Pupils were equal and reacted normally to light. Extraocular movements were full and conjugate.    V/VII-no facial numbness; mild left lower facial weakness. VIII-normal. X-moderately severe dysarthria. XI: trapezius strength/neck flexion strength normal bilaterally XII-midline tongue extension with normal strength. Motor: Severe proximal and severe distal left upper extremity weakness; mild left lower extremity weakness proximally; normal strength of right extremities. Sensory: Normal throughout. Deep Tendon Reflexes: Asymmetric with greater responses elicited from left extremities compared to right extremities. Plantars: Extensor on the left and flexor on the right. Cerebellar: Unable to test due to mental status changes. Carotid auscultation: Normal  Results for orders placed or performed during the hospital encounter of 02/28/15 (from the past 48 hour(s))  CBG monitoring, ED     Status: Abnormal   Collection Time: 02/28/15  7:57 PM  Result Value Ref Range   Glucose-Capillary 275 (H) 70 - 99 mg/dL  CBC WITH DIFFERENTIAL     Status: Abnormal   Collection Time: 02/28/15  8:29 PM  Result Value Ref Range   WBC 10.0 4.0 - 10.5 K/uL   RBC 5.53 4.22 - 5.81 MIL/uL   Hemoglobin 14.4 13.0 - 17.0 g/dL   HCT 42.9 39.0 - 52.0 %   MCV 77.6 (L) 78.0 - 100.0 fL   MCH 26.0 26.0 - 34.0 pg   MCHC 33.6 30.0 - 36.0 g/dL   RDW 13.9 11.5 - 15.5 %   Platelets 168 150 - 400 K/uL   Neutrophils Relative % 69 43 - 77 %   Neutro Abs 7.0 1.7 - 7.7 K/uL   Lymphocytes Relative 21 12 - 46 %   Lymphs Abs 2.1 0.7 - 4.0 K/uL   Monocytes Relative 10 3 - 12 %   Monocytes Absolute 1.0 0.1 - 1.0 K/uL   Eosinophils Relative 0 0 - 5 %   Eosinophils Absolute 0.0 0.0 - 0.7 K/uL   Basophils Relative 0 0 - 1 %   Basophils Absolute 0.0 0.0 - 0.1 K/uL  Comprehensive metabolic panel     Status: Abnormal   Collection  Time: 02/28/15  8:29 PM  Result Value Ref Range   Sodium 139 135 - 145 mmol/L   Potassium 3.3 (L) 3.5 - 5.1 mmol/L   Chloride 105 96 - 112 mmol/L   CO2 25 19 - 32 mmol/L   Glucose, Bld 275 (H) 70 - 99 mg/dL   BUN 18 6 - 23 mg/dL   Creatinine, Ser 1.30 0.50 - 1.35 mg/dL   Calcium 8.9 8.4 - 10.5 mg/dL   Total Protein 7.2 6.0 - 8.3 g/dL   Albumin 3.6 3.5 - 5.2 g/dL   AST 22 0 - 37 U/L   ALT 18 0 - 53 U/L   Alkaline Phosphatase 74 39 - 117 U/L   Total Bilirubin 0.5 0.3 - 1.2 mg/dL   GFR calc non Af Amer 52 (L) >90 mL/min   GFR calc Af Amer 60 (L) >90 mL/min    Comment: (NOTE) The eGFR has been calculated using the CKD EPI equation. This calculation has not been validated in all clinical situations. eGFR's persistently <90 mL/min signify possible Chronic Kidney Disease.    Anion gap 9 5 - 15  Lactic acid, plasma     Status: None   Collection Time: 02/28/15  8:29 PM  Result Value Ref Range   Lactic Acid, Venous 1.4 0.5 -  2.0 mmol/L  Protime-INR     Status: None   Collection Time: 02/28/15  8:29 PM  Result Value Ref Range   Prothrombin Time 14.5 11.6 - 15.2 seconds   INR 1.11 0.00 - 1.49  Ethanol     Status: None   Collection Time: 03/01/15 12:16 AM  Result Value Ref Range   Alcohol, Ethyl (B) <5 0 - 9 mg/dL    Comment:        LOWEST DETECTABLE LIMIT FOR SERUM ALCOHOL IS 11 mg/dL FOR MEDICAL PURPOSES ONLY   Urinalysis, Routine w reflex microscopic     Status: Abnormal   Collection Time: 03/01/15 12:32 AM  Result Value Ref Range   Color, Urine YELLOW YELLOW   APPearance CLEAR CLEAR   Specific Gravity, Urine 1.016 1.005 - 1.030   pH 6.0 5.0 - 8.0   Glucose, UA 250 (A) NEGATIVE mg/dL   Hgb urine dipstick TRACE (A) NEGATIVE   Bilirubin Urine NEGATIVE NEGATIVE   Ketones, ur NEGATIVE NEGATIVE mg/dL   Protein, ur 100 (A) NEGATIVE mg/dL   Urobilinogen, UA 1.0 0.0 - 1.0 mg/dL   Nitrite NEGATIVE NEGATIVE   Leukocytes, UA NEGATIVE NEGATIVE  Urine microscopic-add on     Status:  None   Collection Time: 03/01/15 12:32 AM  Result Value Ref Range   Squamous Epithelial / LPF RARE RARE   RBC / HPF 0-2 <3 RBC/hpf   Dg Chest 2 View  02/28/2015   CLINICAL DATA:  Altered mental status.  Confused and disoriented.  EXAM: CHEST  2 VIEW  COMPARISON:  07/14/2007  FINDINGS: Mild progression cardiomegaly and tortuous thoracic aorta. The lungs are hyperinflated. Bronchial thickening is unchanged from prior. No confluent airspace disease. There is no pleural effusion or pneumothorax. No acute osseous abnormalities are seen.  IMPRESSION: 1. Stable bronchial thickening. 2. Mild progression cardiomegaly and tortuous thoracic aorta from remote prior exam.   Electronically Signed   By: Jeb Levering M.D.   On: 02/28/2015 21:18   Ct Head Wo Contrast  02/28/2015   CLINICAL DATA:  Altered mental status, onset this morning  EXAM: CT HEAD WITHOUT CONTRAST  TECHNIQUE: Contiguous axial images were obtained from the base of the skull through the vertex without intravenous contrast.  COMPARISON:  10/18/2014  FINDINGS: Skull and Sinuses:No fracture or destructive process.  There is patchy mucosal thickening throughout the bilateral paranasal sinuses with atelectasis and near complete opacification of the right maxillary sinus. Best visualized in the nasal cavity, mucosal thickening has polypoid features.  Orbits: Cataract resection, bilateral.  No acute findings.  Brain: No evidence of acute cortical infarction, hemorrhage, hydrocephalus, or mass lesion/mass effect. There is extensive chronic small vessel disease with ischemic gliosis throughout the bilateral cerebral white matter. Chronically indistinct deep gray nuclei, especially the right caudate head and putamen related to small vessel infarcts, ischemic gliosis, and dilated perivascular spaces (presumably from chronic hypertension). Chronic small-vessel disease noted in the central pons.  IMPRESSION: 1. No acute intracranial findings. 2. Stable pattern  of advanced chronic vessel disease; extensive subcortical changes could easily obscure a lacunar infarct. 3. Chronic sinusitis with polypoid features.   Electronically Signed   By: Monte Fantasia M.D.   On: 02/28/2015 21:18   Mr Jodene Nam Head Wo Contrast  02/28/2015   CLINICAL DATA:  Initial evaluation for acute altered mental status, inability to walk.  EXAM: MRI HEAD WITHOUT CONTRAST  MRA HEAD WITHOUT CONTRAST  TECHNIQUE: Multiplanar, multiecho pulse sequences of the brain and surrounding structures were  obtained without intravenous contrast. Angiographic images of the head were obtained using MRA technique without contrast.  COMPARISON:  Prior CT from earlier the same day as well as previous MRI from 11/30/2013.  FINDINGS: MRI HEAD FINDINGS  Diffuse prominence of the CSF containing spaces is compatible with generalized cerebral atrophy. Moderate to severe chronic microvascular ischemic changes again seen involving the periventricular and deep white matter both cerebral hemispheres as well as the central pons. Scattered remote lacunar infarcts within these regions noted. Previously identified small chronic micro hemorrhages not as well seen on today's exam.  There is a small 6 mm focus of restricted diffusion involving the left aspect of the anterior genu of the corpus callosum, consistent with a small acute ischemic infarct (series 5, image 25). No associated hemorrhage or significant mass effect. No other acute intracranial infarct identified. Gray-white matter differentiation otherwise maintained. Normal intravascular flow voids preserved. No acute intracranial hemorrhage.  No mass lesion or midline shift. No hydrocephalus. No extra-axial fluid collection.  Craniocervical junction within normal limits. Pituitary gland unremarkable. No acute abnormality about the orbits.  Right maxillary sinus is opacified. Paranasal sinuses are otherwise largely clear. No mastoid effusion. Inner ear structures normal.  Mild  degenerative changes noted within the upper cervical spine. Bone marrow signal intensity normal. No scalp soft tissue abnormality.  MRA HEAD FINDINGS  ANTERIOR CIRCULATION:  Study is severely limited by motion artifact.  Partially visualized portions of the distal cervical segments of the internal carotid arteries are patent with antegrade flow. The petrous, cavernous, and supra clinoid segments are widely patent. A1 segments and anterior cerebral arteries are patent proximally. Distally 2 segments not well evaluated on this exam.  M1 segments grossly patent bilaterally without proximal branch occlusion or significant stenosis. There is probable multi focal atheromatous irregularity within the M1 segments bilaterally, slightly worse on the left. Distal MCA branches patent proximally, but otherwise not well evaluated on this exam.  POSTERIOR CIRCULATION:  Right vertebral artery is patent to the vertebrobasilar junction, but demonstrates moderate multi focal atheromatous irregularity and stenosis. The left vertebral artery is not visualize, likely occluded. Posterior inferior cerebral arteries not well evaluated on this exam. Basilar artery is tortuous but patent without significant stenosis or occlusion. Superior cerebellar arteries are patent proximally. There is fetal origin of the right PCA with widely patent right posterior communicating artery. Right P2 segment patent proximally. Left P1 and P2 segments patent without definite stenosis or occlusion. Distal branch atheromatous irregularity present within the PCA branches bilaterally. Small left posterior communicating artery present as well.  No aneurysm.  IMPRESSION: MRI HEAD IMPRESSION:  1. 6 mm acute ischemic infarct involving the left aspect of the anterior genu of the corpus callosum. No associated hemorrhage or significant mass effect. 2. No other acute intracranial process identified. 3. Cerebral atrophy with moderate to severe chronic microvascular  ischemic disease involving the periventricular white matter and pons.  MRA HEAD IMPRESSION:  1. Limited study due to motion artifact. No acute proximal branch arterial occlusion or definite hemodynamically significant stenosis identified. 2. Nonvisualization of the left vertebral artery, likely occluded in the neck. Right vertebral artery is patent but with moderate multi focal atheromatous irregularity and stenoses. 3. Distal branch atheromatous irregularity within the PCA branches bilaterally. 4. Fetal origin of the right PCA.   Electronically Signed   By: Jeannine Boga M.D.   On: 02/28/2015 23:28   Mr Brain Wo Contrast  02/28/2015   CLINICAL DATA:  Initial evaluation for acute altered  mental status, inability to walk.  EXAM: MRI HEAD WITHOUT CONTRAST  MRA HEAD WITHOUT CONTRAST  TECHNIQUE: Multiplanar, multiecho pulse sequences of the brain and surrounding structures were obtained without intravenous contrast. Angiographic images of the head were obtained using MRA technique without contrast.  COMPARISON:  Prior CT from earlier the same day as well as previous MRI from 11/30/2013.  FINDINGS: MRI HEAD FINDINGS  Diffuse prominence of the CSF containing spaces is compatible with generalized cerebral atrophy. Moderate to severe chronic microvascular ischemic changes again seen involving the periventricular and deep white matter both cerebral hemispheres as well as the central pons. Scattered remote lacunar infarcts within these regions noted. Previously identified small chronic micro hemorrhages not as well seen on today's exam.  There is a small 6 mm focus of restricted diffusion involving the left aspect of the anterior genu of the corpus callosum, consistent with a small acute ischemic infarct (series 5, image 25). No associated hemorrhage or significant mass effect. No other acute intracranial infarct identified. Gray-white matter differentiation otherwise maintained. Normal intravascular flow voids  preserved. No acute intracranial hemorrhage.  No mass lesion or midline shift. No hydrocephalus. No extra-axial fluid collection.  Craniocervical junction within normal limits. Pituitary gland unremarkable. No acute abnormality about the orbits.  Right maxillary sinus is opacified. Paranasal sinuses are otherwise largely clear. No mastoid effusion. Inner ear structures normal.  Mild degenerative changes noted within the upper cervical spine. Bone marrow signal intensity normal. No scalp soft tissue abnormality.  MRA HEAD FINDINGS  ANTERIOR CIRCULATION:  Study is severely limited by motion artifact.  Partially visualized portions of the distal cervical segments of the internal carotid arteries are patent with antegrade flow. The petrous, cavernous, and supra clinoid segments are widely patent. A1 segments and anterior cerebral arteries are patent proximally. Distally 2 segments not well evaluated on this exam.  M1 segments grossly patent bilaterally without proximal branch occlusion or significant stenosis. There is probable multi focal atheromatous irregularity within the M1 segments bilaterally, slightly worse on the left. Distal MCA branches patent proximally, but otherwise not well evaluated on this exam.  POSTERIOR CIRCULATION:  Right vertebral artery is patent to the vertebrobasilar junction, but demonstrates moderate multi focal atheromatous irregularity and stenosis. The left vertebral artery is not visualize, likely occluded. Posterior inferior cerebral arteries not well evaluated on this exam. Basilar artery is tortuous but patent without significant stenosis or occlusion. Superior cerebellar arteries are patent proximally. There is fetal origin of the right PCA with widely patent right posterior communicating artery. Right P2 segment patent proximally. Left P1 and P2 segments patent without definite stenosis or occlusion. Distal branch atheromatous irregularity present within the PCA branches bilaterally.  Small left posterior communicating artery present as well.  No aneurysm.  IMPRESSION: MRI HEAD IMPRESSION:  1. 6 mm acute ischemic infarct involving the left aspect of the anterior genu of the corpus callosum. No associated hemorrhage or significant mass effect. 2. No other acute intracranial process identified. 3. Cerebral atrophy with moderate to severe chronic microvascular ischemic disease involving the periventricular white matter and pons.  MRA HEAD IMPRESSION:  1. Limited study due to motion artifact. No acute proximal branch arterial occlusion or definite hemodynamically significant stenosis identified. 2. Nonvisualization of the left vertebral artery, likely occluded in the neck. Right vertebral artery is patent but with moderate multi focal atheromatous irregularity and stenoses. 3. Distal branch atheromatous irregularity within the PCA branches bilaterally. 4. Fetal origin of the right PCA.   Electronically Signed  By: Jeannine Boga M.D.   On: 02/28/2015 23:28    Assessment: 74 y.o. male with multiple risk factors for stroke as well as previous stroke presenting with acute small ischemic infarction involving the corpus callosum. This may explain his mental status changes. However, etiology for exacerbation of left-sided weakness is unclear.  Stroke Risk Factors - diabetes mellitus, hyperlipidemia and hypertension  Plan: 1. HgbA1c, fasting lipid panel 2. PT consult, OT consult, Speech consult 3. Echocardiogram 4. Carotid dopplers 5. Prophylactic therapy-Antiplatelet med: Aspirin  6. Risk factor modification 7. Telemetry monitoring  C.R. Nicole Kindred, MD [facility] [phone]  03/01/2015, 1:19 AM

## 2015-03-02 DIAGNOSIS — R531 Weakness: Secondary | ICD-10-CM | POA: Insufficient documentation

## 2015-03-02 LAB — BASIC METABOLIC PANEL WITH GFR
Anion gap: 9 (ref 5–15)
BUN: 11 mg/dL (ref 6–23)
CO2: 25 mmol/L (ref 19–32)
Calcium: 8.7 mg/dL (ref 8.4–10.5)
Chloride: 103 mmol/L (ref 96–112)
Creatinine, Ser: 1.13 mg/dL (ref 0.50–1.35)
GFR calc Af Amer: 71 mL/min — ABNORMAL LOW
GFR calc non Af Amer: 62 mL/min — ABNORMAL LOW
Glucose, Bld: 120 mg/dL — ABNORMAL HIGH (ref 70–99)
Potassium: 2.8 mmol/L — ABNORMAL LOW (ref 3.5–5.1)
Sodium: 137 mmol/L (ref 135–145)

## 2015-03-02 LAB — GLUCOSE, CAPILLARY
GLUCOSE-CAPILLARY: 288 mg/dL — AB (ref 70–99)
Glucose-Capillary: 131 mg/dL — ABNORMAL HIGH (ref 70–99)
Glucose-Capillary: 207 mg/dL — ABNORMAL HIGH (ref 70–99)

## 2015-03-02 LAB — CBC
HCT: 41.1 % (ref 39.0–52.0)
HEMOGLOBIN: 13.7 g/dL (ref 13.0–17.0)
MCH: 25.8 pg — AB (ref 26.0–34.0)
MCHC: 33.3 g/dL (ref 30.0–36.0)
MCV: 77.5 fL — AB (ref 78.0–100.0)
PLATELETS: 166 10*3/uL (ref 150–400)
RBC: 5.3 MIL/uL (ref 4.22–5.81)
RDW: 14.3 % (ref 11.5–15.5)
WBC: 6.8 10*3/uL (ref 4.0–10.5)

## 2015-03-02 LAB — HEMOGLOBIN A1C
HEMOGLOBIN A1C: 10.7 % — AB (ref 4.8–5.6)
MEAN PLASMA GLUCOSE: 260 mg/dL

## 2015-03-02 LAB — MAGNESIUM: Magnesium: 1.8 mg/dL (ref 1.5–2.5)

## 2015-03-02 MED ORDER — LOSARTAN POTASSIUM 50 MG PO TABS
50.0000 mg | ORAL_TABLET | Freq: Every day | ORAL | Status: DC
  Administered 2015-03-02 – 2015-03-03 (×2): 50 mg via ORAL
  Filled 2015-03-02 (×2): qty 1

## 2015-03-02 MED ORDER — POTASSIUM CHLORIDE CRYS ER 20 MEQ PO TBCR
40.0000 meq | EXTENDED_RELEASE_TABLET | Freq: Two times a day (BID) | ORAL | Status: AC
  Administered 2015-03-02 (×2): 40 meq via ORAL
  Filled 2015-03-02 (×2): qty 2

## 2015-03-02 MED ORDER — CLOPIDOGREL BISULFATE 75 MG PO TABS
75.0000 mg | ORAL_TABLET | Freq: Every day | ORAL | Status: DC
  Administered 2015-03-03: 75 mg via ORAL
  Filled 2015-03-02: qty 1

## 2015-03-02 MED ORDER — INSULIN ASPART 100 UNIT/ML ~~LOC~~ SOLN
8.0000 [IU] | Freq: Three times a day (TID) | SUBCUTANEOUS | Status: DC
  Administered 2015-03-02 – 2015-03-03 (×2): 8 [IU] via SUBCUTANEOUS

## 2015-03-02 NOTE — Progress Notes (Signed)
Patients blood pressure was 206/126 at 0542 gave 5 mg of hydralazine at 0553 will re check BP around 0630.

## 2015-03-02 NOTE — Evaluation (Signed)
Physical Therapy Evaluation Patient Details Name: Alejandro FuseChristopher Sallade MRN: [id number] DOB: 30-Dec-1940 Today's Date: 03/02/2015   History of Present Illness  Patient admitted with AMS, slurred speech and worsening weakness.  MRI/MRA showed infarct in anterior genu of corpus callosum and likely occluded left vertebral artery. Patient with PMH of hypertension, hyperlipidemia, poorly controlled diabetes, chronic kidney disease-III, history of stroke with left side weakness.  Clinical Impression  Patient overall did very well with mobility.  Per family he is not quite at baseline, but close.  Feel patient safe to return home with family assistance.  Due to involvement of left side of body, patient is at risk for falls, but did not demonstrate any gross balance deficits during evaluation.  Will follow while in hospital to assist in returning to baseline.      Follow Up Recommendations Home health PT    Equipment Recommendations  None recommended by PT    Recommendations for Other Services       Precautions / Restrictions Precautions Precautions: Fall      Mobility  Bed Mobility               General bed mobility comments: sitting in recliner upon arrival  Transfers Overall transfer level: Needs assistance Equipment used: Straight cane Transfers: Sit to/from Stand Sit to Stand: Min guard         General transfer comment: for balance  Ambulation/Gait Ambulation/Gait assistance: Min guard Ambulation Distance (Feet): 100 Feet Assistive device: Straight cane Gait Pattern/deviations: Step-to pattern;Decreased step length - left;Decreased stance time - left;Decreased stride length;Decreased dorsiflexion - left;Decreased weight shift to left Gait velocity: 0.28 ft/sec (10 feet in 35 sec) Gait velocity interpretation: <1.8 ft/sec, indicative of risk for recurrent falls    Stairs            Wheelchair Mobility    Modified Rankin (Stroke Patients Only) Modified Rankin  (Stroke Patients Only) Pre-Morbid Rankin Score: Moderate disability Modified Rankin: Moderately severe disability     Balance Overall balance assessment: Needs assistance Sitting-balance support: No upper extremity supported;Feet supported Sitting balance-Leahy Scale: Good     Standing balance support: No upper extremity supported Standing balance-Leahy Scale: Fair                               Pertinent Vitals/Pain Pain Assessment: No/denies pain    Home Living Family/patient expects to be discharged to:: Private residence Living Arrangements: Spouse/significant other Available Help at Discharge: Family;Friend(s);Available 24 hours/day Type of Home: House Home Access: Level entry     Home Layout: One level Home Equipment: Cane - single point      Prior Function Level of Independence: Independent with assistive device(s)   Gait / Transfers Assistance Needed: uses cane for ambulation  ADL's / Homemaking Assistance Needed: recently needs assistance with dressing as he injured his right hand        Hand Dominance        Extremity/Trunk Assessment   Upper Extremity Assessment: Defer to OT evaluation           Lower Extremity Assessment: LLE deficits/detail   LLE Deficits / Details: no active ankle movement; otherwise, strength is 4/5 throughout.     Communication   Communication: No difficulties  Cognition Arousal/Alertness: Awake/alert Behavior During Therapy: WFL for tasks assessed/performed Overall Cognitive Status: Within Functional Limits for tasks assessed  General Comments      Exercises        Assessment/Plan    PT Assessment Patient needs continued PT services  PT Diagnosis Difficulty walking;Hemiplegia non-dominant side   PT Problem List Decreased activity tolerance;Decreased balance;Decreased mobility  PT Treatment Interventions Gait training;Functional mobility training;Therapeutic  activities;Therapeutic exercise;Balance training   PT Goals (Current goals can be found in the Care Plan section) Acute Rehab PT Goals Patient Stated Goal: go back home PT Goal Formulation: With patient Time For Goal Achievement: 03/09/15 Potential to Achieve Goals: Good    Frequency Min 3X/week   Barriers to discharge        Co-evaluation               End of Session Equipment Utilized During Treatment: Gait belt Activity Tolerance: Patient tolerated treatment well Patient left: in chair;with chair alarm set;with call bell/phone within reach           Time: 0920-0948 PT Time Calculation (min) (ACUTE ONLY): 28 min   Charges:   PT Evaluation $Initial PT Evaluation Tier I: 1 Procedure PT Treatments $Therapeutic Activity: 8-22 mins   PT G Codes:        Olivia Canter 03/02/2015, 10:00 AM  03/02/2015 Corlis Hove, PT [phone]

## 2015-03-02 NOTE — Progress Notes (Signed)
STROKE TEAM PROGRESS NOTE   HISTORY Daejon Lich is an 74 y.o. Male with hypertension, hyperlipidemia, diabetes mellitus, kidney disease and previous cerebral infarction with left hemiparesis presenting with slurred speech and confusion as well as worsening of left-sided weakness. Onset is unclear. Family has noticed speech changes and confusion over the past couple days with worsening on the afternoon of 14 2016. CT scan of his head showed chronic small vessel changes with no acute findings. MRI showed a small left corpus callosum acute ischemic infarction. Patient has been taking aspirin daily for antiplatelet therapy. NIH stroke score was 7.  LSN: Unclear tPA Given: No: Unclear when last known well mRankin:  SUBJECTIVE (INTERVAL HISTORY) The patient's wife and daughter are present. The patient has early dementia but is mentally more focused today. The family feels that he is still not quite back to baseline.  OBJECTIVE Temp:  [97.7 F (36.5 C)-98.5 F (36.9 C)] 98.1 F (36.7 C) (04/16 0948) Pulse Rate:  [77-98] 98 (04/16 0948) Cardiac Rhythm:  [-] Heart block (04/15 2000) Resp:  [16-20] 20 (04/16 0948) BP: (158-206)/(98-126) 165/110 mmHg (04/16 0948) SpO2:  [98 %-100 %] 99 % (04/16 0948)   Recent Labs Lab 03/01/15 1133 03/01/15 1650 03/01/15 2212 03/02/15 0632 03/02/15 1127  GLUCAP 229* 260* 250* 131* 288*    Recent Labs Lab 02/28/15 2029 03/02/15 0650  NA 139 137  K 3.3* 2.8*  CL 105 103  CO2 25 25  GLUCOSE 275* 120*  BUN 18 11  CREATININE 1.30 1.13  CALCIUM 8.9 8.7    Recent Labs Lab 02/28/15 2029  AST 22  ALT 18  ALKPHOS 74  BILITOT 0.5  PROT 7.2  ALBUMIN 3.6    Recent Labs Lab 02/28/15 2029 03/02/15 0650  WBC 10.0 6.8  NEUTROABS 7.0  --   HGB 14.4 13.7  HCT 42.9 41.1  MCV 77.6* 77.5*  PLT 168 166   No results for input(s): CKTOTAL, CKMB, CKMBINDEX, TROPONINI in the last 168 hours.  Recent Labs  02/28/15 2029  LABPROT 14.5  INR  1.11    Recent Labs  03/01/15 0032  COLORURINE YELLOW  LABSPEC 1.016  PHURINE 6.0  GLUCOSEU 250*  HGBUR TRACE*  BILIRUBINUR NEGATIVE  KETONESUR NEGATIVE  PROTEINUR 100*  UROBILINOGEN 1.0  NITRITE NEGATIVE  LEUKOCYTESUR NEGATIVE       Component Value Date/Time   CHOL 177 03/01/2015 0657   TRIG 107 03/01/2015 0657   HDL 40 03/01/2015 0657   CHOLHDL 4.4 03/01/2015 0657   VLDL 21 03/01/2015 0657   LDLCALC 116* 03/01/2015 0657   Lab Results  Component Value Date   HGBA1C 10.7* 03/01/2015   No results found for: LABOPIA, COCAINSCRNUR, LABBENZ, AMPHETMU, THCU, LABBARB   Recent Labs Lab 03/01/15 0016  ETH <5   I have personally reviewed the radiological images below and agree with the radiology interpretations.  Dg Chest 2 View 02/28/2015    1. Stable bronchial thickening.  2. Mild progression cardiomegaly and tortuous thoracic aorta from remote prior exam.     Ct Head Wo Contrast  02/28/2015    1. No acute intracranial findings.  2. Stable pattern of advanced chronic vessel disease; extensive subcortical changes could easily obscure a lacunar infarct.  3. Chronic sinusitis with polypoid features.    Mr Maxine Glenn Head Wo Contrast 02/28/2015    MRI HEAD IMPRESSION:   1. 6 mm acute ischemic infarct involving the left aspect of the anterior genu of the corpus callosum. No associated hemorrhage or  significant mass effect.  2. No other acute intracranial process identified.  3. Cerebral atrophy with moderate to severe chronic microvascular ischemic disease involving the periventricular white matter and pons.    MRA HEAD IMPRESSION:   1. Limited study due to motion artifact. No acute proximal branch arterial occlusion or definite hemodynamically significant stenosis identified.  2. Nonvisualization of the left vertebral artery, likely occluded in the neck. Right vertebral artery is patent but with moderate multi focal atheromatous irregularity and stenoses.  3. Distal branch  atheromatous irregularity within the PCA branches bilaterally.  4. Fetal origin of the right PCA.     2-D echocardiogram 03/01/2015 Study Conclusions - Left ventricle: The cavity size was normal. There was mild concentric hypertrophy. Systolic function was normal. The estimated ejection fraction was in the range of 55% to 60%. Wall motion was normal; there were no regional wall motion abnormalities. Doppler parameters are consistent with abnormal left ventricular relaxation (grade 1 diastolic dysfunction).  EEG 03/01/2015 Impression: This predominantly drowsy and asleep EEG is normal.  A normal EEG does not exclude a clinical diagnosis of epilepsy. Clinical correlation is advised.  CUS - pending  PHYSICAL EXAM  Temp:  [97.7 F (36.5 C)-98.5 F (36.9 C)] 98.1 F (36.7 C) (04/16 0948) Pulse Rate:  [77-98] 98 (04/16 0948) Resp:  [16-20] 20 (04/16 0948) BP: (158-206)/(98-126) 165/110 mmHg (04/16 0948) SpO2:  [98 %-100 %] 99 % (04/16 0948)  General - Well nourished, well developed, in no apparent distress.  Ophthalmologic - fundi not visualized due to incorporation.  Cardiovascular - Regular rate and rhythm.  Mental Status -  Awake, alert, orientated to months, person, place and the president, not orientated to year. Language including expression, naming, repetition, comprehension was assessed and found intact. Psychological impersistence.  Cranial Nerves II - XII - II - Visual field intact OU. III, IV, VI - Extraocular movements intact. V - Facial sensation decreased on the left. VII - left facial droop. VIII - Hearing & vestibular intact bilaterally. X - Palate elevates symmetrically, mild to moderate dysarthria. XI - Chin turning & shoulder shrug intact bilaterally. XII - Tongue protrusion to the left.  Motor Strength - The patient's strength was 5/5 RUE and RLE, but 3/5 LUE proximally and 0/5 distally, 4/5 LLE.  Bulk was normal and fasciculations were  absent.   Motor Tone - Muscle tone was assessed at the neck and appendages and was normal.  Reflexes - The patient's reflexes were 1+ in all extremities and he had no pathological reflexes.  Sensory - Light touch, temperature/pinprick were assessed and were decreased on the left.    Coordination - The patient had normal movements in the right hand with no ataxia or dysmetria.  Tremor was present intermittently on the left upper extremity.  Gait and Station -  not tested due to safety concerns.  ASSESSMENT/PLAN Mr. Luna FuseChristopher Calvi is a 74 y.o. male with history of hypertension, hyperlipidemia, diabetes mellitus, kidney disease and previous cerebral infarction with left hemiparesis presenting with slurred speech and confusion as well as worsening of left-sided weakness. He did not receive IV t-PA due to unknown time of onset.  Encephalopathy, hypertensive vs. delirium   Patient presenting symptoms consistent with encephalopathy  Blood pressure very high, uncontrolled so far  According to family, patient has symptoms and signs of dementia  Recommend to gradually control blood pressure to normotensive in 5-7 days  EEG showed no seizure  Stroke:  Dominant 6 mm acute ischemic infarct involving the left  aspect of the anterior genu of the corpus callosum, incidental finding, likely due to small vessel disease.  MRI  as above  MRA - Nonvisualization of the left vertebral artery, likely occluded in the neck. Right vertebral artery is patent but with moderate multi focal atheromatous irregularity and stenoses.   Carotid Doppler - pending   2D Echo  EF 55-60%. No cardiac source of emboli noted.  LDL - 116, not at goal  HgbA1c - 10.7  EEG - normal  Subcutaneous heparin for VTE prophylaxis Diet Carb Modified Fluid consistency:: Thin; Room service appropriate?: Yes  aspirin 81 mg orally every day prior to admission, now on aspirin 325 mg orally every day  Ongoing aggressive stroke  risk factor management  Therapy recommendations: Home health physical therapy recommended.  Disposition:  Pending  Hypertension  Home meds: Cozaar 100 mg daily  Permissive hypertension <220/120 for 24-48 hours and then gradually normalize within 5-7 days   Currently significant elevated, especially the DBP  Will resume losartan at one half home dose.   Hyperlipidemia  Home meds:  Lipitor 10 mg daily - resumed in hospital  LDL 116, goal < 70  Increase Lipitor to 40 mg daily  Continue statin at discharge  Diabetes  HgbA1c 10.7, goal < 7.0  Uncontrolled  Other Stroke Risk Factors  Advanced age  Cigarette smoker, quit smoking  Obesity, Body mass index is 28.76 kg/(m^2).   Hx stroke/TIA  Obstructive sleep apnea per family - needs Outpatient sleep study  Other Active Problems  Hypokalemia - supplementation ordered  Early dementia  Possible left upper extremity focal seizures - negative EEG  Possible obstructive sleep apnea - plan outpatient sleep study   PLAN  Per Dr. Pearlean Brownie change aspirin to Plavix  Await carotid Dopplers  Should be able to discharge soon  Outpatient sleep study  Needs better glucose control  Gradually normalize blood pressure (losartan should help with potassium correction)  Continue Lipitor at discharge  Supplement potassium as needed  Follow-up Dr. Pearlean Brownie in 2 months  Hospital day # 2  Delton See PA-C [facility] Pager [phone] 03/02/2015, 12:54 PM  I, the attending vascular neurologist, have personally obtained a history, examined the patient, evaluated laboratory data, individually viewed imaging studies and agree with radiology interpretations. I also obtained additional history from pt's daughter and wife at bedside. Together with the NP/PA, we formulated the assessment and plan of care which reflects our mutual decision.  I have made any additions or clarifications directly to the above note and  agree with the findings and plan as currently documented.   74 year old male with previous stroke 15 years ago with left hemiparesis, hypertension, hyperlipidemia, diabetes, CKD was admitted for one day history of altered mental status, not eating well, shuffling gait, slow responses, confusion. Symptoms consistent with encephalopathy, in the setting of significant elevation of blood pressure and cognitive impairment. MRI showed incidental finding of punctate left ACA infarct, likely due to small vessel disease. Increase aspirin to 325, continue antiplatelet and statin for stroke prevention. Aggressive management of risk factors.    Delia Heady, MD       To contact Stroke Continuity provider, please refer to [email or web address]. After hours, contact General Neurology

## 2015-03-02 NOTE — Progress Notes (Signed)
[facility] PROGRESS NOTE  Alejandro [name] WUJ:[id number] DOB: 12/27/1940 DOA: 02/28/2015 PCP: Dorrene German, MD  Assessment/Plan: 1. 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 Noted on MRI 2. Stroke team following 3. Carotid dopplers are still pending 4. 2d echo done. Unremarkable findings with normal LVEF 5. Transitioned to plavix for secondary stroke prevention 2. Acute encephalopathy 1. Seems improved, as pt is alert and oriented this AM 2. Unclear etiology -question seizures? 3. Pt has undergone EEG, normal 4. Per Neurology 3. HTN 1. Uncontrolled, but have allowed for permissive HTN in the setting of acute CVA 2. PRN hydralazine ordered 3. 1/2 dose of home losartan resumed. Cont to titrate for bp control 4. DM2 1. Last a1c on 11/30/13 of 14.8, repeat a1c of 10.7 2. On SSI coverage 3. Glucose in the mid 200's 4. On lantus 70 units QHS per home regimen  5. Also reportedly on humalog 50/50 with meals 6. While in hospital, have continued on premeal aspart. Will increase to 8 units pre-meals 7. Consulted Diabetic Coordinator for assistance 5. CKD3 1. Cr stable 2. Cont to monitor renal function 6. Hypokalemia 1. Replaced 2. Cont to monitor and replace as needed 3. Will check Mg 7. HLD 1. On statin 2. LDL of 116 8. DVT prophylaxis 1. Heparin subQ  Code Status: Full Family Communication: Pt in room, wife and daughter at bedside Disposition Plan: Possible home soon with home PT   Consultants:  Neurology  Procedures:    Antibiotics:  none (indicate start date, and stop date if known)  HPI/Subjective: States he feels better today.  Objective: Filed Vitals:   03/02/15 0542 03/02/15 0646 03/02/15 0948 03/02/15 1310  BP: 206/126 158/98 165/110 175/106  Pulse: 88  98 91  Temp: 98 F (36.7 C)  98.1 F (36.7 C) 98 F (36.7 C)  TempSrc: Oral  Oral Oral  Resp: Height:      Weight:      SpO2: 98%  99% 99%   No  intake or output data in the 24 hours ending 03/02/15 1452 Filed Weights   03/01/15 0520  Weight: 93.486 kg (206 lb 1.6 oz)    Exam:   General:  Awake, sitting in chair, in nad  Cardiovascular: regular, s1, s2  Respiratory: normal resp effort, no wheezing  Abdomen: soft, obese, nondistended  Musculoskeletal: perfused, no clubbing  Neuro: continued L sided facial droop with 1/5 LUE strength and 3/5 LLE strength   Data Reviewed: Basic Metabolic Panel:  Recent Labs Lab 02/28/15 2029 03/02/15 0650  NA 139 137  [name] 3.3* 2.8*  CL 105 103  CO2 25 25  GLUCOSE 275* 120*  BUN 18 11  CREATININE 1.30 1.13  CALCIUM 8.9 8.7   Liver Function Tests:  Recent Labs Lab 02/28/15 2029  AST 22  ALT 18  ALKPHOS 74  BILITOT 0.5  PROT 7.2  ALBUMIN 3.6   No results for input(s): LIPASE, AMYLASE in the last 168 hours. No results for input(s): AMMONIA in the last 168 hours. CBC:  Recent Labs Lab 02/28/15 2029 03/02/15 0650  WBC 10.0 6.8  NEUTROABS 7.0  --   HGB 14.4 13.7  HCT 42.9 41.1  MCV 77.6* 77.5*  PLT 168 166   Cardiac Enzymes: No results for input(s): CKTOTAL, CKMB, CKMBINDEX, TROPONINI in the last 168 hours. BNP (last 3 results) No results for input(s): BNP in the last 8760 hours.  ProBNP (last 3 results) No results for  input(s): PROBNP in the last 8760 hours.  CBG:  Recent Labs Lab 03/01/15 1133 03/01/15 1650 03/01/15 2212 03/02/15 0632 03/02/15 1127  GLUCAP 229* 260* 250* 131* 288*    No results found for this or any previous visit (from the past 240 hour(s)).   Studies: Dg Chest 2 View  02/28/2015   CLINICAL DATA:  Altered mental status.  Confused and disoriented.  EXAM: CHEST  2 VIEW  COMPARISON:  07/14/2007  FINDINGS: Mild progression cardiomegaly and tortuous thoracic aorta. The lungs are hyperinflated. Bronchial thickening is unchanged from prior. No confluent airspace disease. There is no pleural effusion or pneumothorax. No acute osseous  abnormalities are seen.  IMPRESSION: 1. Stable bronchial thickening. 2. Mild progression cardiomegaly and tortuous thoracic aorta from remote prior exam.   Electronically Signed   By: Rubye OaksMelanie  Ehinger M.D.   On: 02/28/2015 21:18   Ct Head Wo Contrast  02/28/2015   CLINICAL DATA:  Altered mental status, onset this morning  EXAM: CT HEAD WITHOUT CONTRAST  TECHNIQUE: Contiguous axial images were obtained from the base of the skull through the vertex without intravenous contrast.  COMPARISON:  10/18/2014  FINDINGS: Skull and Sinuses:No fracture or destructive process.  There is patchy mucosal thickening throughout the bilateral paranasal sinuses with atelectasis and near complete opacification of the right maxillary sinus. Best visualized in the nasal cavity, mucosal thickening has polypoid features.  Orbits: Cataract resection, bilateral.  No acute findings.  Brain: No evidence of acute cortical infarction, hemorrhage, hydrocephalus, or mass lesion/mass effect. There is extensive chronic small vessel disease with ischemic gliosis throughout the bilateral cerebral white matter. Chronically indistinct deep gray nuclei, especially the right caudate head and putamen related to small vessel infarcts, ischemic gliosis, and dilated perivascular spaces (presumably from chronic hypertension). Chronic small-vessel disease noted in the central pons.  IMPRESSION: 1. No acute intracranial findings. 2. Stable pattern of advanced chronic vessel disease; extensive subcortical changes could easily obscure a lacunar infarct. 3. Chronic sinusitis with polypoid features.   Electronically Signed   By: Marnee SpringJonathon  Watts M.D.   On: 02/28/2015 21:18   Mr Maxine GlennMra Head Wo Contrast  02/28/2015   CLINICAL DATA:  Initial evaluation for acute altered mental status, inability to walk.  EXAM: MRI HEAD WITHOUT CONTRAST  MRA HEAD WITHOUT CONTRAST  TECHNIQUE: Multiplanar, multiecho pulse sequences of the brain and surrounding structures were obtained  without intravenous contrast. Angiographic images of the head were obtained using MRA technique without contrast.  COMPARISON:  Prior CT from earlier the same day as well as previous MRI from 11/30/2013.  FINDINGS: MRI HEAD FINDINGS  Diffuse prominence of the CSF containing spaces is compatible with generalized cerebral atrophy. Moderate to severe chronic microvascular ischemic changes again seen involving the periventricular and deep white matter both cerebral hemispheres as well as the central pons. Scattered remote lacunar infarcts within these regions noted. Previously identified small chronic micro hemorrhages not as well seen on today's exam.  There is a small 6 mm focus of restricted diffusion involving the left aspect of the anterior genu of the corpus callosum, consistent with a small acute ischemic infarct (series 5, image 25). No associated hemorrhage or significant mass effect. No other acute intracranial infarct identified. Gray-white matter differentiation otherwise maintained. Normal intravascular flow voids preserved. No acute intracranial hemorrhage.  No mass lesion or midline shift. No hydrocephalus. No extra-axial fluid collection.  Craniocervical junction within normal limits. Pituitary gland unremarkable. No acute abnormality about the orbits.  Right maxillary sinus is  opacified. Paranasal sinuses are otherwise largely clear. No mastoid effusion. Inner ear structures normal.  Mild degenerative changes noted within the upper cervical spine. Bone marrow signal intensity normal. No scalp soft tissue abnormality.  MRA HEAD FINDINGS  ANTERIOR CIRCULATION:  Study is severely limited by motion artifact.  Partially visualized portions of the distal cervical segments of the internal carotid arteries are patent with antegrade flow. The petrous, cavernous, and supra clinoid segments are widely patent. A1 segments and anterior cerebral arteries are patent proximally. Distally 2 segments not well evaluated  on this exam.  M1 segments grossly patent bilaterally without proximal branch occlusion or significant stenosis. There is probable multi focal atheromatous irregularity within the M1 segments bilaterally, slightly worse on the left. Distal MCA branches patent proximally, but otherwise not well evaluated on this exam.  POSTERIOR CIRCULATION:  Right vertebral artery is patent to the vertebrobasilar junction, but demonstrates moderate multi focal atheromatous irregularity and stenosis. The left vertebral artery is not visualize, likely occluded. Posterior inferior cerebral arteries not well evaluated on this exam. Basilar artery is tortuous but patent without significant stenosis or occlusion. Superior cerebellar arteries are patent proximally. There is fetal origin of the right PCA with widely patent right posterior communicating artery. Right P2 segment patent proximally. Left P1 and P2 segments patent without definite stenosis or occlusion. Distal branch atheromatous irregularity present within the PCA branches bilaterally. Small left posterior communicating artery present as well.  No aneurysm.  IMPRESSION: MRI HEAD IMPRESSION:  1. 6 mm acute ischemic infarct involving the left aspect of the anterior genu of the corpus callosum. No associated hemorrhage or significant mass effect. 2. No other acute intracranial process identified. 3. Cerebral atrophy with moderate to severe chronic microvascular ischemic disease involving the periventricular white matter and pons.  MRA HEAD IMPRESSION:  1. Limited study due to motion artifact. No acute proximal branch arterial occlusion or definite hemodynamically significant stenosis identified. 2. Nonvisualization of the left vertebral artery, likely occluded in the neck. Right vertebral artery is patent but with moderate multi focal atheromatous irregularity and stenoses. 3. Distal branch atheromatous irregularity within the PCA branches bilaterally. 4. Fetal origin of the right  PCA.   Electronically Signed   By: Rise Mu M.D.   On: 02/28/2015 23:28   Mr Brain Wo Contrast  02/28/2015   CLINICAL DATA:  Initial evaluation for acute altered mental status, inability to walk.  EXAM: MRI HEAD WITHOUT CONTRAST  MRA HEAD WITHOUT CONTRAST  TECHNIQUE: Multiplanar, multiecho pulse sequences of the brain and surrounding structures were obtained without intravenous contrast. Angiographic images of the head were obtained using MRA technique without contrast.  COMPARISON:  Prior CT from earlier the same day as well as previous MRI from 11/30/2013.  FINDINGS: MRI HEAD FINDINGS  Diffuse prominence of the CSF containing spaces is compatible with generalized cerebral atrophy. Moderate to severe chronic microvascular ischemic changes again seen involving the periventricular and deep white matter both cerebral hemispheres as well as the central pons. Scattered remote lacunar infarcts within these regions noted. Previously identified small chronic micro hemorrhages not as well seen on today's exam.  There is a small 6 mm focus of restricted diffusion involving the left aspect of the anterior genu of the corpus callosum, consistent with a small acute ischemic infarct (series 5, image 25). No associated hemorrhage or significant mass effect. No other acute intracranial infarct identified. Gray-white matter differentiation otherwise maintained. Normal intravascular flow voids preserved. No acute intracranial hemorrhage.  No mass lesion  or midline shift. No hydrocephalus. No extra-axial fluid collection.  Craniocervical junction within normal limits. Pituitary gland unremarkable. No acute abnormality about the orbits.  Right maxillary sinus is opacified. Paranasal sinuses are otherwise largely clear. No mastoid effusion. Inner ear structures normal.  Mild degenerative changes noted within the upper cervical spine. Bone marrow signal intensity normal. No scalp soft tissue abnormality.  MRA HEAD  FINDINGS  ANTERIOR CIRCULATION:  Study is severely limited by motion artifact.  Partially visualized portions of the distal cervical segments of the internal carotid arteries are patent with antegrade flow. The petrous, cavernous, and supra clinoid segments are widely patent. A1 segments and anterior cerebral arteries are patent proximally. Distally 2 segments not well evaluated on this exam.  M1 segments grossly patent bilaterally without proximal branch occlusion or significant stenosis. There is probable multi focal atheromatous irregularity within the M1 segments bilaterally, slightly worse on the left. Distal MCA branches patent proximally, but otherwise not well evaluated on this exam.  POSTERIOR CIRCULATION:  Right vertebral artery is patent to the vertebrobasilar junction, but demonstrates moderate multi focal atheromatous irregularity and stenosis. The left vertebral artery is not visualize, likely occluded. Posterior inferior cerebral arteries not well evaluated on this exam. Basilar artery is tortuous but patent without significant stenosis or occlusion. Superior cerebellar arteries are patent proximally. There is fetal origin of the right PCA with widely patent right posterior communicating artery. Right P2 segment patent proximally. Left P1 and P2 segments patent without definite stenosis or occlusion. Distal branch atheromatous irregularity present within the PCA branches bilaterally. Small left posterior communicating artery present as well.  No aneurysm.  IMPRESSION: MRI HEAD IMPRESSION:  1. 6 mm acute ischemic infarct involving the left aspect of the anterior genu of the corpus callosum. No associated hemorrhage or significant mass effect. 2. No other acute intracranial process identified. 3. Cerebral atrophy with moderate to severe chronic microvascular ischemic disease involving the periventricular white matter and pons.  MRA HEAD IMPRESSION:  1. Limited study due to motion artifact. No acute  proximal branch arterial occlusion or definite hemodynamically significant stenosis identified. 2. Nonvisualization of the left vertebral artery, likely occluded in the neck. Right vertebral artery is patent but with moderate multi focal atheromatous irregularity and stenoses. 3. Distal branch atheromatous irregularity within the PCA branches bilaterally. 4. Fetal origin of the right PCA.   Electronically Signed   By: Rise Mu M.D.   On: 02/28/2015 23:28    Scheduled Meds: . atorvastatin  40 mg Oral Daily  . cholecalciferol  2,000 Units Oral Daily  . [START ON 03/03/2015] clopidogrel  75 mg Oral Daily  . heparin  5,000 Units Subcutaneous 3 times per day  . insulin aspart  0-9 Units Subcutaneous TID WC  . insulin aspart  5 Units Subcutaneous TID WC  . insulin glargine  70 Units Subcutaneous QHS  . losartan  50 mg Oral Daily  . multivitamin with minerals  1 tablet Oral Daily  . potassium chloride  40 mEq Oral BID   Continuous Infusions: . sodium chloride 1,000 mL (03/01/15 1710)    Principal Problem:   Stroke Active Problems:   Hypokalemia   Uncontrolled diabetes mellitus   Chronic progressive renal failure, stage 3 (moderate)   Diabetic neuropathy   Hypertension   Left hemiparesis   HLD (hyperlipidemia)   Acute encephalopathy   Alejandro [name]  [facility] Pager [phone]. If 7PM-7AM, please contact night-coverage at [email or web address], password [facility] 03/02/2015, 2:52 PM  LOS: 2 days

## 2015-03-02 NOTE — Evaluation (Signed)
Speech Language Pathology Evaluation Patient Details Name: Luna FuseChristopher Iseminger MRN: [id number] DOB: 04/17/1941 Today's Date: 03/02/2015 Time: 7829-56211215-1230 SLP Time Calculation (min) (ACUTE ONLY): 15 min  Problem List:  Patient Active Problem List   Diagnosis Date Noted  . Acute encephalopathy 03/01/2015  . HLD (hyperlipidemia) 02/28/2015  . Stroke 02/28/2015  . Hypokalemia 11/30/2013  . Syncope and collapse 11/30/2013  . Uncontrolled diabetes mellitus 11/30/2013  . Polyuria 11/30/2013  . Chronic progressive renal failure, stage 3 (moderate)   . Diabetic neuropathy   . Hypertension   . Left hemiparesis    Past Medical History:  Past Medical History  Diagnosis Date  . Stroke 2000    left side deficits  . Uncontrolled diabetes mellitus   . Left hemiparesis   . Hypertension   . Diabetic neuropathy   . Chronic progressive renal failure, stage 3 (moderate)   . HLD (hyperlipidemia)    Past Surgical History:  Past Surgical History  Procedure Laterality Date  . Eye surgery    . Cardiac catheterization    . Cardiovascular stress test     HPI:  74 y.o. male with past medical history of hypertension, hyperlipidemia, poorly controlled diabetes, chronic kidney disease-III, stroke with left side weakness/arm hemiplegia, who presents with AMS, slurred speech and worsening weakness. Marland Kitchen. MRI showed 6 mm acute ischemic infarct involving the left aspect of the anterior genu of the corpus callosum. MRA showed possibly occluded left vertebral artery. CXR Stable bronchial thickening. 2. Mild progression cardiomegaly and tortuous thoracic aorta from remote prior exam.   Assessment / Plan / Recommendation Clinical Impression  Pt demonstrates mild higher level cognitive impairments, present at baseline.  Speech and language otherwise WFL. Offered basic strategies for pt and wife, who is providing necessary assist at home. No acute SLP f/u needed will sign off.     SLP Assessment  Patient does not  need any further Speech Lanaguage Pathology Services    Follow Up Recommendations       Frequency and Duration        Pertinent Vitals/Pain Pain Assessment: No/denies pain   SLP Goals     SLP Evaluation Prior Functioning  Cognitive/Linguistic Baseline: Baseline deficits Baseline deficit details: memory, assist from wife Type of Home: House  Lives With: Spouse Available Help at Discharge: Family;Friend(s);Available 24 hours/day Vocation: Retired   IT consultantCognition  Overall Cognitive Status: History of cognitive impairments - at baseline Arousal/Alertness: Awake/alert Orientation Level: Oriented to person;Oriented to place;Oriented to situation;Oriented to time Attention: Alternating Alternating Attention: Appears intact Memory: Impaired Memory Impairment: Storage deficit;Retrieval deficit Awareness: Appears intact Problem Solving: Impaired Problem Solving Impairment: Functional complex Executive Function: Reasoning Reasoning: Impaired Reasoning Impairment: Functional complex    Comprehension  Auditory Comprehension Overall Auditory Comprehension: Appears within functional limits for tasks assessed    Expression Verbal Expression Overall Verbal Expression: Appears within functional limits for tasks assessed   Oral / Motor Oral Motor/Sensory Function Overall Oral Motor/Sensory Function: Impaired at baseline Motor Speech Overall Motor Speech: Appears within functional limits for tasks assessed   GO    Harlon DittyBonnie Nimo Verastegui, MA CCC-SLP [phone]  Claudine MoutonDeBlois, Ryun Velez Caroline 03/02/2015, 2:52 PM

## 2015-03-03 ENCOUNTER — Emergency Department (HOSPITAL_COMMUNITY)
Admission: EM | Admit: 2015-03-03 | Discharge: 2015-03-04 | Disposition: A | Discharge status: Home / Self Care | Payer: Medicare Other | Attending: Emergency Medicine | Admitting: Emergency Medicine

## 2015-03-03 ENCOUNTER — Encounter (HOSPITAL_COMMUNITY): Payer: Self-pay | Admitting: *Deleted

## 2015-03-03 DIAGNOSIS — I129 Hypertensive chronic kidney disease with stage 1 through stage 4 chronic kidney disease, or unspecified chronic kidney disease: Secondary | ICD-10-CM | POA: Diagnosis not present

## 2015-03-03 DIAGNOSIS — Z794 Long term (current) use of insulin: Secondary | ICD-10-CM | POA: Insufficient documentation

## 2015-03-03 DIAGNOSIS — Z880 Allergy status to penicillin: Secondary | ICD-10-CM | POA: Insufficient documentation

## 2015-03-03 DIAGNOSIS — E1140 Type 2 diabetes mellitus with diabetic neuropathy, unspecified: Secondary | ICD-10-CM | POA: Insufficient documentation

## 2015-03-03 DIAGNOSIS — Z8669 Personal history of other diseases of the nervous system and sense organs: Secondary | ICD-10-CM | POA: Insufficient documentation

## 2015-03-03 DIAGNOSIS — Z79899 Other long term (current) drug therapy: Secondary | ICD-10-CM | POA: Insufficient documentation

## 2015-03-03 DIAGNOSIS — E785 Hyperlipidemia, unspecified: Secondary | ICD-10-CM | POA: Insufficient documentation

## 2015-03-03 DIAGNOSIS — Z8673 Personal history of transient ischemic attack (TIA), and cerebral infarction without residual deficits: Secondary | ICD-10-CM | POA: Insufficient documentation

## 2015-03-03 DIAGNOSIS — I10 Essential (primary) hypertension: Secondary | ICD-10-CM

## 2015-03-03 DIAGNOSIS — Z87891 Personal history of nicotine dependence: Secondary | ICD-10-CM | POA: Insufficient documentation

## 2015-03-03 DIAGNOSIS — N183 Chronic kidney disease, stage 3 (moderate): Secondary | ICD-10-CM | POA: Insufficient documentation

## 2015-03-03 LAB — BASIC METABOLIC PANEL
Anion gap: 9 (ref 5–15)
BUN: 12 mg/dL (ref 6–23)
CO2: 24 mmol/L (ref 19–32)
CREATININE: 1.15 mg/dL (ref 0.50–1.35)
Calcium: 8.9 mg/dL (ref 8.4–10.5)
Chloride: 106 mmol/L (ref 96–112)
GFR calc Af Amer: 70 mL/min — ABNORMAL LOW (ref >90)
GFR calc non Af Amer: 60 mL/min — ABNORMAL LOW (ref >90)
GLUCOSE: 99 mg/dL (ref 70–99)
POTASSIUM: 3.5 mmol/L (ref 3.5–5.1)
SODIUM: 139 mmol/L (ref 135–145)

## 2015-03-03 LAB — CBC WITH DIFFERENTIAL/PLATELET
BASOS PCT: 0 % (ref 0–1)
Basophils Absolute: 0 10*3/uL (ref 0.0–0.1)
Eosinophils Absolute: 0.1 10*3/uL (ref 0.0–0.7)
Eosinophils Relative: 2 % (ref 0–5)
HCT: 43.3 % (ref 39.0–52.0)
Hemoglobin: 14.5 g/dL (ref 13.0–17.0)
Lymphocytes Relative: 29 % (ref 12–46)
Lymphs Abs: 2.1 10*3/uL (ref 0.7–4.0)
MCH: 25.6 pg — ABNORMAL LOW (ref 26.0–34.0)
MCHC: 33.5 g/dL (ref 30.0–36.0)
MCV: 76.5 fL — AB (ref 78.0–100.0)
MONOS PCT: 10 % (ref 3–12)
Monocytes Absolute: 0.7 10*3/uL (ref 0.1–1.0)
Neutro Abs: 4.2 10*3/uL (ref 1.7–7.7)
Neutrophils Relative %: 59 % (ref 43–77)
Platelets: 181 10*3/uL (ref 150–400)
RBC: 5.66 MIL/uL (ref 4.22–5.81)
RDW: 14.3 % (ref 11.5–15.5)
WBC: 7.1 10*3/uL (ref 4.0–10.5)

## 2015-03-03 LAB — COMPREHENSIVE METABOLIC PANEL
ALT: 20 U/L (ref 0–53)
AST: 27 U/L (ref 0–37)
Albumin: 3.2 g/dL — ABNORMAL LOW (ref 3.5–5.2)
Alkaline Phosphatase: 80 U/L (ref 39–117)
Anion gap: 12 (ref 5–15)
BUN: 15 mg/dL (ref 6–23)
CO2: 22 mmol/L (ref 19–32)
CREATININE: 1.49 mg/dL — AB (ref 0.50–1.35)
Calcium: 9 mg/dL (ref 8.4–10.5)
Chloride: 101 mmol/L (ref 96–112)
GFR calc Af Amer: 51 mL/min — ABNORMAL LOW (ref >90)
GFR calc non Af Amer: 44 mL/min — ABNORMAL LOW (ref >90)
Glucose, Bld: 313 mg/dL — ABNORMAL HIGH (ref 70–99)
Potassium: 3.7 mmol/L (ref 3.5–5.1)
SODIUM: 135 mmol/L (ref 135–145)
TOTAL PROTEIN: 6.8 g/dL (ref 6.0–8.3)
Total Bilirubin: 0.8 mg/dL (ref 0.3–1.2)

## 2015-03-03 LAB — GLUCOSE, CAPILLARY
GLUCOSE-CAPILLARY: 194 mg/dL — AB (ref 70–99)
Glucose-Capillary: 283 mg/dL — ABNORMAL HIGH (ref 70–99)
Glucose-Capillary: 86 mg/dL (ref 70–99)

## 2015-03-03 LAB — CBG MONITORING, ED: GLUCOSE-CAPILLARY: 291 mg/dL — AB (ref 70–99)

## 2015-03-03 LAB — TROPONIN I: Troponin I: <0.03 ng/mL (ref <0.031)

## 2015-03-03 MED ORDER — MAGNESIUM SULFATE 2 GM/50ML IV SOLN
2.0000 g | Freq: Once | INTRAVENOUS | Status: DC
  Filled 2015-03-03: qty 50

## 2015-03-03 MED ORDER — HYDRALAZINE HCL 20 MG/ML IJ SOLN: 10.0000 mg | INTRAMUSCULAR | Status: DC | PRN

## 2015-03-03 MED ORDER — HYDRALAZINE HCL 20 MG/ML IJ SOLN
5.0000 mg | INTRAMUSCULAR | Status: DC | PRN
  Administered 2015-03-03: 5 mg via INTRAVENOUS
  Filled 2015-03-03: qty 1

## 2015-03-03 MED ORDER — ATORVASTATIN CALCIUM 40 MG PO TABS: 40.0000 mg | ORAL_TABLET | Freq: Every day | ORAL | Status: AC

## 2015-03-03 MED ORDER — POTASSIUM CHLORIDE CRYS ER 20 MEQ PO TBCR
40.0000 meq | EXTENDED_RELEASE_TABLET | Freq: Two times a day (BID) | ORAL | Status: DC
  Administered 2015-03-03: 40 meq via ORAL
  Filled 2015-03-03: qty 2

## 2015-03-03 MED ORDER — AMLODIPINE BESYLATE 5 MG PO TABS: 5.0000 mg | ORAL_TABLET | Freq: Every day | ORAL | Status: DC

## 2015-03-03 MED ORDER — CLOPIDOGREL BISULFATE 75 MG PO TABS: 75.0000 mg | ORAL_TABLET | Freq: Every day | ORAL | Status: DC

## 2015-03-03 MED ORDER — LABETALOL HCL 5 MG/ML IV SOLN
10.0000 mg | Freq: Once | INTRAVENOUS | Status: AC
  Administered 2015-03-03: 10 mg via INTRAVENOUS
  Filled 2015-03-03: qty 4

## 2015-03-03 MED ORDER — AMLODIPINE BESYLATE 5 MG PO TABS
5.0000 mg | ORAL_TABLET | Freq: Every day | ORAL | Status: DC
  Administered 2015-03-03: 5 mg via ORAL
  Filled 2015-03-03: qty 1

## 2015-03-03 MED ORDER — LOSARTAN POTASSIUM 50 MG PO TABS: 100.0000 mg | ORAL_TABLET | Freq: Every day | ORAL | Status: DC

## 2015-03-03 MED ORDER — LOSARTAN POTASSIUM 50 MG PO TABS
50.0000 mg | ORAL_TABLET | ORAL | Status: AC
  Administered 2015-03-03: 50 mg via ORAL
  Filled 2015-03-03: qty 1

## 2015-03-03 NOTE — Progress Notes (Signed)
CARE MANAGEMENT NOTE 03/03/2015  Patient:  Alejandro [name],Alejandro [name]   Account Number:  [id number]  Date Initiated:  03/01/2015  Documentation initiated by:  Jiles CrockerHANDLER,BRENDA  Subjective/Objective Assessment:   ADMITTED WITH STROKE     Action/Plan:   CM FOLLOWING FOR DCP   Anticipated DC Date:  03/05/2015   Anticipated DC Plan:  HOME W HOME HEALTH SERVICES      DC Planning Services  CM consult      [facility]   Choice offered to / List presented to:  C-3 Spouse   DME arranged  3-N-1      DME agency  [facility]     [facility]  [facility] RN  HH-2 PT  HH-3 OT  HH-4 NURSE'S AIDE      HH agency  [facility]   Status of service:  Completed, signed off Medicare Important Message given?  YES (If response is "NO", the following Medicare IM given date fields will be blank) Date Medicare IM given:  03/03/2015 Medicare IM given by:  [facility]Luie Laneve Date Additional Medicare IM given:   Additional Medicare IM given by:    Discharge Disposition:  HOME W HOME HEALTH SERVICES  Per UR Regulation:  Reviewed for med. necessity/level of care/duration of stay  If discussed at Long Length of Stay Meetings, dates discussed:    Comments:  03/03/2015 1440 NCM spoke to pt and gave permission to speak to wife, Alejandro [name]. [facility] with [facility] Requested 3n1 bedside commode for home. Contacted [facility] with new HH referral. Contacted [facility] for delivery of DME to room today for possible late dc today. Isidoro DonningAlesia Elon Lomeli RN CCM Case Mgmt phone [phone]  4/15/2016Abelino Derrick- B CHANDLER RN,BSN,MHA [phone]

## 2015-03-03 NOTE — Discharge Summary (Signed)
Physician Discharge Summary  Alejandro [name] GEX:[id number] DOB: August 28, 1941 DOA: 02/28/2015  PCP: Dorrene German, MD  Admit date: 02/28/2015 Discharge date: 03/03/2015  Time spent: 20 minutes  Recommendations for Outpatient Follow-up:  1. Follow up with PCP in 1-2 weeks 2. Follow up with Dr. Pearlean Brownie in 2 months 3. Recommend outpatient sleep study 4. Please follow up blood pressure and titrate medications accordingly  Discharge Diagnoses:  Principal Problem:   Stroke Active Problems:   Hypokalemia   Uncontrolled diabetes mellitus   Chronic progressive renal failure, stage 3 (moderate)   Diabetic neuropathy   Hypertension   Left hemiparesis   HLD (hyperlipidemia)   Acute encephalopathy   Weakness   Discharge Condition: Stable  Diet recommendation: Heart healthy, diabetic  Filed Weights   03/01/15 0520  Weight: 93.486 kg (206 lb 1.6 oz)    History of present illness:  Please see admit h and p from 4/14 for details. Briefly, pt presented with AMS and slurred speech. Pt was admitted for further work up.  Hospital Course:  1. 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 Noted on MRI 2. Stroke team following 3. Carotid dopplers unremarkable 4. 2d echo done. Unremarkable findings with normal LVEF 5. Transitioned to plavix for secondary stroke prevention 6. Statin dose increased to 40mg  7. Pt cleared for discharge per Neurology with close outpatient follow up 2. Acute encephalopathy 1. Seems improved, as pt is alert and oriented this AM 2. Pt has undergone EEG, normal 3. Per Neurology 3. HTN 1. Uncontrolled, but have allowed for permissive HTN in the setting of acute CVA 2. PRN hydralazine ordered 3. Losartan increased to full home dose 4. Will add norvasc 5mg  on discharge 5. Pt to have BP followed up on clinic visit scheduled within this week 4. DM2 1. Last a1c on 11/30/13 of 14.8, repeat a1c of 10.7 2. On SSI coverage 3. Glucose in the mid  200's 4. On lantus 70 units QHS per home regimen  5. Also reportedly on humalog 50/50 with meals 6. While in hospital, have continued on premeal aspart, increased dose to 8 units pre-meals 7. Consulted Diabetic Coordinator for assistance 8. For now, would have pt resume home meds on discharge 5. CKD3 1. Cr stable 2. Cont to monitor renal function 6. Hypokalemia 1. Replaced 2. Cont to monitor and replace as needed 3. Will check Mg 7. HLD 1. On statin 2. LDL of 116 8. DVT prophylaxis 1. Heparin subQ  Consultations:  Neurology  Discharge Exam: Filed Vitals:   03/02/15 2140 03/03/15 0130 03/03/15 0534 03/03/15 0943  BP: 193/117 120/59 196/113 191/107  Pulse: 89 73 83 87  Temp: 98.7 F (37.1 C) 98 F (36.7 C) 97.6 F (36.4 C) 97.9 F (36.6 C)  TempSrc: Oral Oral Oral Oral  Resp: 20 20 20 18   Height:      Weight:      SpO2: 99% 98% 99% 97%    General: awake, in nad Cardiovascular: regular, s1, s2 Respiratory: normal resp effort, no wheezing  Discharge Instructions      Discharge Instructions    Ambulatory referral to Neurology    Complete by:  As directed   Dr. Pearlean Brownie requests follow up for this patient in 2 months.            Medication List    STOP taking these medications        aspirin EC 81 MG tablet     cephALEXin 500 MG capsule  Commonly known as:  KEFLEX      TAKE these medications        amLODipine 5 MG tablet  Commonly known as:  NORVASC  Take 1 tablet (5 mg total) by mouth daily.     atorvastatin 40 MG tablet  Commonly known as:  LIPITOR  Take 1 tablet (40 mg total) by mouth daily.     Calcium-Magnesium-Zinc Tabs  Take 1 tablet by mouth daily.     cholecalciferol 1000 UNITS tablet  Commonly known as:  VITAMIN D  Take 2,000 Units by mouth daily.     clopidogrel 75 MG tablet  Commonly known as:  PLAVIX  Take 1 tablet (75 mg total) by mouth daily.     furosemide 20 MG tablet  Commonly known as:  LASIX  Take 20 mg by mouth  daily.     insulin lispro protamine-lispro (50-50) 100 UNIT/ML Susp injection  Commonly known as:  HUMALOG 50/50 MIX  Inject 60 Units into the skin 3 (three) times daily with meals.     losartan 100 MG tablet  Commonly known as:  COZAAR  Take 100 mg by mouth daily.     multivitamin with minerals tablet  Take 1 tablet by mouth daily.     potassium chloride 10 MEQ tablet  Commonly known as:  K-DUR,KLOR-CON  Take 2 tablets (20 mEq total) by mouth daily.     TOUJEO SOLOSTAR 300 UNIT/ML Sopn  Generic drug:  Insulin Glargine  Inject 70 Units into the skin at bedtime.       Allergies  Allergen Reactions  . Coumadin [Warfarin Sodium] Palpitations    Heart races  . Penicillins Hives and Anaphylaxis   Follow-up Information    Follow up with AVBUERE,EDWIN A, MD. Schedule an appointment as soon as possible for a visit in 1 week.   Specialty:  Internal Medicine   Why:  Hospital follow up   Contact information:   [address] [place] [address] [phone]       Follow up with SETHI,PRAMOD, MD. Schedule an appointment as soon as possible for a visit in 2 months.   Specialties:  Neurology, Radiology   Why:  Hospital follow up   Contact information:   [address] [place] [address] [phone]        The results of significant diagnostics from this hospitalization (including imaging, microbiology, ancillary and laboratory) are listed below for reference.    Significant Diagnostic Studies: Dg Chest 2 View  02/28/2015   CLINICAL DATA:  Altered mental status.  Confused and disoriented.  EXAM: CHEST  2 VIEW  COMPARISON:  07/14/2007  FINDINGS: Mild progression cardiomegaly and tortuous thoracic aorta. The lungs are hyperinflated. Bronchial thickening is unchanged from prior. No confluent airspace disease. There is no pleural effusion or pneumothorax. No acute osseous abnormalities are seen.  IMPRESSION: 1. Stable bronchial thickening. 2. Mild progression  cardiomegaly and tortuous thoracic aorta from remote prior exam.   Electronically Signed   By: Rubye Oaks M.D.   On: 02/28/2015 21:18   Ct Head Wo Contrast  02/28/2015   CLINICAL DATA:  Altered mental status, onset this morning  EXAM: CT HEAD WITHOUT CONTRAST  TECHNIQUE: Contiguous axial images were obtained from the base of the skull through the vertex without intravenous contrast.  COMPARISON:  10/18/2014  FINDINGS: Skull and Sinuses:No fracture or destructive process.  There is patchy mucosal thickening throughout the bilateral paranasal sinuses with atelectasis and near complete opacification of  the right maxillary sinus. Best visualized in the nasal cavity, mucosal thickening has polypoid features.  Orbits: Cataract resection, bilateral.  No acute findings.  Brain: No evidence of acute cortical infarction, hemorrhage, hydrocephalus, or mass lesion/mass effect. There is extensive chronic small vessel disease with ischemic gliosis throughout the bilateral cerebral white matter. Chronically indistinct deep gray nuclei, especially the right caudate head and putamen related to small vessel infarcts, ischemic gliosis, and dilated perivascular spaces (presumably from chronic hypertension). Chronic small-vessel disease noted in the central pons.  IMPRESSION: 1. No acute intracranial findings. 2. Stable pattern of advanced chronic vessel disease; extensive subcortical changes could easily obscure a lacunar infarct. 3. Chronic sinusitis with polypoid features.   Electronically Signed   By: Marnee Spring M.D.   On: 02/28/2015 21:18   Mr Maxine Glenn Head Wo Contrast  02/28/2015   CLINICAL DATA:  Initial evaluation for acute altered mental status, inability to walk.  EXAM: MRI HEAD WITHOUT CONTRAST  MRA HEAD WITHOUT CONTRAST  TECHNIQUE: Multiplanar, multiecho pulse sequences of the brain and surrounding structures were obtained without intravenous contrast. Angiographic images of the head were obtained using MRA  technique without contrast.  COMPARISON:  Prior CT from earlier the same day as well as previous MRI from 11/30/2013.  FINDINGS: MRI HEAD FINDINGS  Diffuse prominence of the CSF containing spaces is compatible with generalized cerebral atrophy. Moderate to severe chronic microvascular ischemic changes again seen involving the periventricular and deep white matter both cerebral hemispheres as well as the central pons. Scattered remote lacunar infarcts within these regions noted. Previously identified small chronic micro hemorrhages not as well seen on today's exam.  There is a small 6 mm focus of restricted diffusion involving the left aspect of the anterior genu of the corpus callosum, consistent with a small acute ischemic infarct (series 5, image 25). No associated hemorrhage or significant mass effect. No other acute intracranial infarct identified. Gray-white matter differentiation otherwise maintained. Normal intravascular flow voids preserved. No acute intracranial hemorrhage.  No mass lesion or midline shift. No hydrocephalus. No extra-axial fluid collection.  Craniocervical junction within normal limits. Pituitary gland unremarkable. No acute abnormality about the orbits.  Right maxillary sinus is opacified. Paranasal sinuses are otherwise largely clear. No mastoid effusion. Inner ear structures normal.  Mild degenerative changes noted within the upper cervical spine. Bone marrow signal intensity normal. No scalp soft tissue abnormality.  MRA HEAD FINDINGS  ANTERIOR CIRCULATION:  Study is severely limited by motion artifact.  Partially visualized portions of the distal cervical segments of the internal carotid arteries are patent with antegrade flow. The petrous, cavernous, and supra clinoid segments are widely patent. A1 segments and anterior cerebral arteries are patent proximally. Distally 2 segments not well evaluated on this exam.  M1 segments grossly patent bilaterally without proximal branch occlusion  or significant stenosis. There is probable multi focal atheromatous irregularity within the M1 segments bilaterally, slightly worse on the left. Distal MCA branches patent proximally, but otherwise not well evaluated on this exam.  POSTERIOR CIRCULATION:  Right vertebral artery is patent to the vertebrobasilar junction, but demonstrates moderate multi focal atheromatous irregularity and stenosis. The left vertebral artery is not visualize, likely occluded. Posterior inferior cerebral arteries not well evaluated on this exam. Basilar artery is tortuous but patent without significant stenosis or occlusion. Superior cerebellar arteries are patent proximally. There is fetal origin of the right PCA with widely patent right posterior communicating artery. Right P2 segment patent proximally. Left P1 and P2 segments patent  without definite stenosis or occlusion. Distal branch atheromatous irregularity present within the PCA branches bilaterally. Small left posterior communicating artery present as well.  No aneurysm.  IMPRESSION: MRI HEAD IMPRESSION:  1. 6 mm acute ischemic infarct involving the left aspect of the anterior genu of the corpus callosum. No associated hemorrhage or significant mass effect. 2. No other acute intracranial process identified. 3. Cerebral atrophy with moderate to severe chronic microvascular ischemic disease involving the periventricular white matter and pons.  MRA HEAD IMPRESSION:  1. Limited study due to motion artifact. No acute proximal branch arterial occlusion or definite hemodynamically significant stenosis identified. 2. Nonvisualization of the left vertebral artery, likely occluded in the neck. Right vertebral artery is patent but with moderate multi focal atheromatous irregularity and stenoses. 3. Distal branch atheromatous irregularity within the PCA branches bilaterally. 4. Fetal origin of the right PCA.   Electronically Signed   By: Rise Mu M.D.   On: 02/28/2015 23:28    Mr Brain Wo Contrast  02/28/2015   CLINICAL DATA:  Initial evaluation for acute altered mental status, inability to walk.  EXAM: MRI HEAD WITHOUT CONTRAST  MRA HEAD WITHOUT CONTRAST  TECHNIQUE: Multiplanar, multiecho pulse sequences of the brain and surrounding structures were obtained without intravenous contrast. Angiographic images of the head were obtained using MRA technique without contrast.  COMPARISON:  Prior CT from earlier the same day as well as previous MRI from 11/30/2013.  FINDINGS: MRI HEAD FINDINGS  Diffuse prominence of the CSF containing spaces is compatible with generalized cerebral atrophy. Moderate to severe chronic microvascular ischemic changes again seen involving the periventricular and deep white matter both cerebral hemispheres as well as the central pons. Scattered remote lacunar infarcts within these regions noted. Previously identified small chronic micro hemorrhages not as well seen on today's exam.  There is a small 6 mm focus of restricted diffusion involving the left aspect of the anterior genu of the corpus callosum, consistent with a small acute ischemic infarct (series 5, image 25). No associated hemorrhage or significant mass effect. No other acute intracranial infarct identified. Gray-white matter differentiation otherwise maintained. Normal intravascular flow voids preserved. No acute intracranial hemorrhage.  No mass lesion or midline shift. No hydrocephalus. No extra-axial fluid collection.  Craniocervical junction within normal limits. Pituitary gland unremarkable. No acute abnormality about the orbits.  Right maxillary sinus is opacified. Paranasal sinuses are otherwise largely clear. No mastoid effusion. Inner ear structures normal.  Mild degenerative changes noted within the upper cervical spine. Bone marrow signal intensity normal. No scalp soft tissue abnormality.  MRA HEAD FINDINGS  ANTERIOR CIRCULATION:  Study is severely limited by motion artifact.  Partially  visualized portions of the distal cervical segments of the internal carotid arteries are patent with antegrade flow. The petrous, cavernous, and supra clinoid segments are widely patent. A1 segments and anterior cerebral arteries are patent proximally. Distally 2 segments not well evaluated on this exam.  M1 segments grossly patent bilaterally without proximal branch occlusion or significant stenosis. There is probable multi focal atheromatous irregularity within the M1 segments bilaterally, slightly worse on the left. Distal MCA branches patent proximally, but otherwise not well evaluated on this exam.  POSTERIOR CIRCULATION:  Right vertebral artery is patent to the vertebrobasilar junction, but demonstrates moderate multi focal atheromatous irregularity and stenosis. The left vertebral artery is not visualize, likely occluded. Posterior inferior cerebral arteries not well evaluated on this exam. Basilar artery is tortuous but patent without significant stenosis or occlusion. Superior cerebellar arteries  are patent proximally. There is fetal origin of the right PCA with widely patent right posterior communicating artery. Right P2 segment patent proximally. Left P1 and P2 segments patent without definite stenosis or occlusion. Distal branch atheromatous irregularity present within the PCA branches bilaterally. Small left posterior communicating artery present as well.  No aneurysm.  IMPRESSION: MRI HEAD IMPRESSION:  1. 6 mm acute ischemic infarct involving the left aspect of the anterior genu of the corpus callosum. No associated hemorrhage or significant mass effect. 2. No other acute intracranial process identified. 3. Cerebral atrophy with moderate to severe chronic microvascular ischemic disease involving the periventricular white matter and pons.  MRA HEAD IMPRESSION:  1. Limited study due to motion artifact. No acute proximal branch arterial occlusion or definite hemodynamically significant stenosis  identified. 2. Nonvisualization of the left vertebral artery, likely occluded in the neck. Right vertebral artery is patent but with moderate multi focal atheromatous irregularity and stenoses. 3. Distal branch atheromatous irregularity within the PCA branches bilaterally. 4. Fetal origin of the right PCA.   Electronically Signed   By: Rise MuBenjamin  McClintock M.D.   On: 02/28/2015 23:28    Microbiology: No results found for this or any previous visit (from the past 240 hour(s)).   Labs: Basic Metabolic Panel:  Recent Labs Lab 02/28/15 2029 03/02/15 0650 03/02/15 1550 03/03/15 0630  NA 139 137  --  139  K 3.3* 2.8*  --  3.5  CL 105 103  --  106  CO2 25 25  --  24  GLUCOSE 275* 120*  --  99  BUN 18 11  --  12  CREATININE 1.30 1.13  --  1.15  CALCIUM 8.9 8.7  --  8.9  MG  --   --  1.8  --    Liver Function Tests:  Recent Labs Lab 02/28/15 2029  AST 22  ALT 18  ALKPHOS 74  BILITOT 0.5  PROT 7.2  ALBUMIN 3.6   No results for input(s): LIPASE, AMYLASE in the last 168 hours. No results for input(s): AMMONIA in the last 168 hours. CBC:  Recent Labs Lab 02/28/15 2029 03/02/15 0650  WBC 10.0 6.8  NEUTROABS 7.0  --   HGB 14.4 13.7  HCT 42.9 41.1  MCV 77.6* 77.5*  PLT 168 166   Cardiac Enzymes: No results for input(s): CKTOTAL, CKMB, CKMBINDEX, TROPONINI in the last 168 hours. BNP: BNP (last 3 results) No results for input(s): BNP in the last 8760 hours.  ProBNP (last 3 results) No results for input(s): PROBNP in the last 8760 hours.  CBG:  Recent Labs Lab 03/02/15 0632 03/02/15 1127 03/02/15 1608 03/03/15 0640 03/03/15 1108  GLUCAP 131* 288* 207* 86 194*    Signed:  CHIU, STEPHEN K  [facility] 03/03/2015, 11:36 AM

## 2015-03-03 NOTE — ED Provider Notes (Signed)
CSN: [id number]     Arrival date & time 03/03/15  2202 History  This chart was scribed for Gilda Creasehristopher J Pollina, MD by Tanda RockersMargaux Venter, ED Scribe. This patient was seen in room A07C/A07C and the patient's care was started at 11:15 PM.    Chief Complaint  Patient presents with  . Hypertension   The history is provided by the patient. No language interpreter was used.     HPI Comments: Alejandro [name] is a 74 y.o. male with hx multiple prior strokes, DM, and HTN who presents to the Emergency Department complaining of hypertension that began earlier tonight. Family checked blood pressure approximately 2 hours ago and reports it being 202 systolic. He denies headaches, dizziness, vision changes, chest pain, shortness of breath, or any other symptoms.  Pt was seen in ED on 4/14 (3 days ago) for aphasia, disorientation, and generalized weakness. Pt had MRI done which showed a stroke. Pt was admitted to the hospital and discharged earlier today. Pt has taken his medications as suggested since being discharged.   Past Medical History  Diagnosis Date  . Stroke 2000    left side deficits  . Uncontrolled diabetes mellitus   . Left hemiparesis   . Hypertension   . Diabetic neuropathy   . Chronic progressive renal failure, stage 3 (moderate)   . HLD (hyperlipidemia)    Past Surgical History  Procedure Laterality Date  . Eye surgery    . Cardiac catheterization    . Cardiovascular stress test     Family History  Problem Relation Age of Onset  . Hypertension     History  Substance Use Topics  . Smoking status: Former Games developermoker  . Smokeless tobacco: Never Used  . Alcohol Use: No    Review of Systems  Eyes: Negative for visual disturbance.  Respiratory: Negative for shortness of breath.   Cardiovascular: Negative for chest pain.       Positive for hypertension.   Neurological: Negative for dizziness and headaches.  All other systems reviewed and are negative.     Allergies   Coumadin and Penicillins  Home Medications   Prior to Admission medications   Medication Sig Start Date End Date Taking? Authorizing Provider  amLODipine (NORVASC) 5 MG tablet Take 1 tablet (5 mg total) by mouth daily. 03/03/15  Yes Jerald KiefStephen K Chiu, MD  atorvastatin (LIPITOR) 40 MG tablet Take 1 tablet (40 mg total) by mouth daily. 03/03/15  Yes Jerald KiefStephen K Chiu, MD  cholecalciferol (VITAMIN D) 1000 UNITS tablet Take 2,000 Units by mouth daily.    Yes Historical Provider, MD  clopidogrel (PLAVIX) 75 MG tablet Take 1 tablet (75 mg total) by mouth daily. 03/03/15  Yes Jerald KiefStephen K Chiu, MD  furosemide (LASIX) 20 MG tablet Take 20 mg by mouth daily. 09/27/14  Yes Historical Provider, MD  Insulin Glargine (TOUJEO SOLOSTAR) 300 UNIT/ML SOPN Inject 70 Units into the skin at bedtime.    Yes Historical Provider, MD  insulin lispro protamine-lispro (HUMALOG 50/50 MIX) (50-50) 100 UNIT/ML SUSP injection Inject 60 Units into the skin 3 (three) times daily with meals.    Yes Historical Provider, MD  losartan (COZAAR) 100 MG tablet Take 100 mg by mouth daily.   Yes Historical Provider, MD  Multiple Minerals (CALCIUM-MAGNESIUM-ZINC) TABS Take 1 tablet by mouth daily.   Yes Historical Provider, MD  Multiple Vitamins-Minerals (MULTIVITAMIN WITH MINERALS) tablet Take 1 tablet by mouth daily.   Yes Historical Provider, MD  potassium chloride (K-DUR,KLOR-CON) 10 MEQ tablet Take  2 tablets (20 mEq total) by mouth daily. Patient taking differently: Take 10 mEq by mouth daily.  12/02/13  Yes Costin Otelia Sergeant, MD   Triage Vitals: BP 208/132 mmHg  Pulse 95  Temp(Src) 97.8 F (36.6 C) (Oral)  Resp 31  Wt 219 lb 3 oz (99.423 kg)  SpO2 96%   Physical Exam  Constitutional: He is oriented to person, place, and time. He appears well-developed and well-nourished. No distress.  HENT:  Head: Normocephalic and atraumatic.  Right Ear: Hearing normal.  Left Ear: Hearing normal.  Nose: Nose normal.  Mouth/Throat: Oropharynx is  clear and moist and mucous membranes are normal.  Eyes: Conjunctivae and EOM are normal. Pupils are equal, round, and reactive to light.  Neck: Normal range of motion. Neck supple.  Cardiovascular: Regular rhythm, S1 normal and S2 normal.  Exam reveals no gallop and no friction rub.   No murmur heard. Pulmonary/Chest: Effort normal and breath sounds normal. No respiratory distress. He exhibits no tenderness.  Abdominal: Soft. Normal appearance and bowel sounds are normal. There is no hepatosplenomegaly. There is no tenderness. There is no rebound, no guarding, no tenderness at McBurney's point and negative Murphy's sign. No hernia.  Musculoskeletal: Normal range of motion.  Neurological: He is alert and oriented to person, place, and time. No cranial nerve deficit or sensory deficit. Coordination normal. GCS eye subscore is 4. GCS verbal subscore is 5. GCS motor subscore is 6.  Chronic left sided hemiparesis.   Skin: Skin is warm, dry and intact. No rash noted. No cyanosis.  Psychiatric: He has a normal mood and affect. His speech is normal and behavior is normal. Thought content normal.  Nursing note and vitals reviewed.   ED Course  Procedures (including critical care time)  DIAGNOSTIC STUDIES: Oxygen Saturation is 96% on RA, normal by my interpretation.    COORDINATION OF CARE: 11:19 PM-Discussed treatment plan which includes Troponin, CBC, CMP, CBG Monitoring with pt at bedside and pt agreed to plan.   Labs Review Labs Reviewed  CBC WITH DIFFERENTIAL/PLATELET - Abnormal; Notable for the following:    MCV 76.5 (*)    MCH 25.6 (*)    All other components within normal limits  COMPREHENSIVE METABOLIC PANEL - Abnormal; Notable for the following:    Glucose, Bld 313 (*)    Creatinine, Ser 1.49 (*)    Albumin 3.2 (*)    GFR calc non Af Amer 44 (*)    GFR calc Af Amer 51 (*)    All other components within normal limits  CBG MONITORING, ED - Abnormal; Notable for the following:     Glucose-Capillary 291 (*)    All other components within normal limits  TROPONIN I    Imaging Review No results found.   EKG Interpretation   Date/Time:  Sunday March 03 2015 22:43:46 EDT Ventricular Rate:  97 PR Interval:  182 QRS Duration: 77 QT Interval:  339 QTC Calculation: 431 R Axis:   80 Text Interpretation:  Sinus rhythm Probable anteroseptal infarct, old No  significant change since last tracing Confirmed by [facility]  MD, MARTHA  [id number]) on 03/03/2015 11:16:29 PM      MDM   Final diagnoses:  Essential hypertension   Patient presents to the ER for evaluation of asymptomatic hypertension. Patient was admitted to the hospital 3 days ago for acute stroke. Patient was discharged earlier today. Patient did have a history of hypertension and was hypertensive during hospitalization. Blood pressure was not aggressively treated because  of the acute stroke. He was discharged on his Cozaar, was to start amlodipine tomorrow morning. Family checked his blood pressure today and it was elevated. He was not expressing any headache, blurred vision, speech disturbance, chest pain, shortness of breath. Patient has no new neurologic findings. He does have left-sided deficit, left upper extremity greater than left lower. This is consistent with his documented exam during hospitalization. He agrees that there has been no change. No concern for new stroke at this time.  Patient was markedly hypertensive on arrival. He was treated with a combination of labetalol and amlodipine. Case was discussed with Dr. Pearson Grippe, on call for the hospitalist. He did not feel that the patient will require repeat hospitalization for management of his blood pressure. He felt that the patient could be safely discharged, continue the course of Cozaar and amlodipine as was prescribed at discharge and follow-up with primary doctor. I did tell the family to return to the ER for worsening blood pressure or symptomatic  hypertension.  I personally performed the services described in this documentation, which was scribed in my presence. The recorded information has been reviewed and is accurate.       Gilda Crease, MD 03/04/15 [phone]

## 2015-03-03 NOTE — Discharge Instructions (Signed)
Ischemic Stroke °A stroke (cerebrovascular accident) is the sudden death of brain tissue. It is a medical emergency. A stroke can cause permanent loss of brain function. This can cause problems with different parts of your body. A transient ischemic attack (TIA) is different because it does not cause permanent damage. A TIA is a short-lived problem of poor blood flow affecting a part of the brain. A TIA is also a serious problem because having a TIA greatly increases the chances of having a stroke. When symptoms first develop, you cannot know if the problem might be a stroke or a TIA. °CAUSES  °A stroke is caused by a decrease of oxygen supply to an area of your brain. It is usually the result of a small blood clot or collection of cholesterol or fat (plaque) that blocks blood flow in the brain. A stroke can also be caused by blocked or damaged carotid arteries.  °RISK FACTORS °· High blood pressure (hypertension). °· High cholesterol. °· Diabetes mellitus. °· Heart disease. °· The buildup of plaque in the blood vessels (peripheral artery disease or atherosclerosis). °· The buildup of plaque in the blood vessels providing blood and oxygen to the brain (carotid artery stenosis). °· An abnormal heart rhythm (atrial fibrillation). °· Obesity. °· Smoking. °· Taking oral contraceptives (especially in combination with smoking). °· Physical inactivity. °· A diet high in fats, salt (sodium), and calories. °· Alcohol use. °· Use of illegal drugs (especially cocaine and methamphetamine). °· Being African American. °· Being over the age of 55. °· Family history of stroke. °· Previous history of blood clots, stroke, TIA, or heart attack. °· Sickle cell disease. °SYMPTOMS  °These symptoms usually develop suddenly, or may be newly present upon awakening from sleep: °· Sudden weakness or numbness of the face, arm, or leg, especially on one side of the body. °· Sudden trouble walking or difficulty moving arms or legs. °· Sudden  confusion. °· Sudden personality changes. °· Trouble speaking (aphasia) or understanding. °· Difficulty swallowing. °· Sudden trouble seeing in one or both eyes. °· Double vision. °· Dizziness. °· Loss of balance or coordination. °· Sudden severe headache with no known cause. °· Trouble reading or writing. °DIAGNOSIS  °Your health care provider can often determine the presence or absence of a stroke based on your symptoms, history, and physical exam. Computed tomography (CT) of the brain is usually performed to confirm the stroke, determine causes, and determine stroke severity. Other tests may be done to find the cause of the stroke. These tests may include: °· Electrocardiography. °· Continuous heart monitoring. °· Echocardiography. °· Carotid ultrasonography. °· Magnetic resonance imaging (MRI). °· A scan of the brain circulation. °· Blood tests. °PREVENTION  °The risk of a stroke can be decreased by appropriately treating high blood pressure, high cholesterol, diabetes, heart disease, and obesity and by quitting smoking, limiting alcohol, and staying physically active. °TREATMENT  °Time is of the essence. It is important to seek treatment at the first sign of these symptoms because you may receive a medicine to dissolve the clot (thrombolytic) that cannot be given if too much time has passed since your symptoms began. Even if you do not know when your symptoms began, get treatment as soon as possible as there are other treatment options available including oxygen, intravenous (IV) fluids, and medicines to thin the blood (anticoagulants). Treatment of stroke depends on the duration, severity, and cause of your symptoms. Medicines and dietary changes may be used to address diabetes, high blood   pressure, and other risk factors. Physical, speech, and occupational therapists will assess you and work with you to improve any functions impaired by the stroke. Measures will be taken to prevent short-term and long-term  complications, including infection from breathing foreign material into the lungs (aspiration pneumonia), blood clots in the legs, bedsores, and falls. Rarely, surgery may be needed to remove large blood clots or to open up blocked arteries. °HOME CARE INSTRUCTIONS  °· Take medicines only as directed by your health care provider. Follow the directions carefully. Medicines may be used to control risk factors for a stroke. Be sure you understand all your medicine instructions. °· You may be told to take a medicine to thin the blood, such as aspirin or the anticoagulant warfarin. Warfarin needs to be taken exactly as instructed. °¨ Too much and too little warfarin are both dangerous. Too much warfarin increases the risk of bleeding. Too little warfarin continues to allow the risk for blood clots. While taking warfarin, you will need to have regular blood tests to measure your blood clotting time. These blood tests usually include both the PT and INR tests. The PT and INR results allow your health care provider to adjust your dose of warfarin. The dose can change for many reasons. It is critically important that you take warfarin exactly as prescribed, and that you have your PT and INR levels drawn exactly as directed. °¨ Many foods, especially foods high in vitamin K, can interfere with warfarin and affect the PT and INR results. Foods high in vitamin K include spinach, kale, broccoli, cabbage, collard and turnip greens, brussels sprouts, peas, cauliflower, seaweed, and parsley, as well as beef and pork liver, green tea, and soybean oil. You should eat a consistent amount of foods high in vitamin K. Avoid major changes in your diet, or notify your health care provider before changing your diet. Arrange a visit with a dietitian to answer your questions. °¨ Many medicines can interfere with warfarin and affect the PT and INR results. You must tell your health care provider about any and all medicines you take. This  includes all vitamins and supplements. Be especially cautious with aspirin and anti-inflammatory medicines. Do not take or discontinue any prescribed or over-the-counter medicine except on the advice of your health care provider or pharmacist. °¨ Warfarin can have side effects, such as excessive bruising or bleeding. You will need to hold pressure over cuts for longer than usual. Your health care provider or pharmacist will discuss other potential side effects. °¨ Avoid sports or activities that may cause injury or bleeding. °¨ Be mindful when shaving, flossing your teeth, or handling sharp objects. °¨ Alcohol can change the body's ability to handle warfarin. It is best to avoid alcoholic drinks or consume only very small amounts while taking warfarin. Notify your health care provider if you change your alcohol intake. °¨ Notify your dentist or other health care providers before procedures. °· If swallow studies have determined that your swallowing reflex is present, you should eat healthy foods. Including 5 or more servings of fruits and vegetables a day may reduce the risk of stroke. Foods may need to be a certain consistency (soft or pureed), or small bites may need to be taken in order to avoid aspirating or choking. Certain dietary changes may be advised to address high blood pressure, high cholesterol, diabetes, or obesity. °¨ Food choices that are low in sodium, saturated fat, trans fat, and cholesterol are recommended to manage high blood pressure. °¨   Food choies that are high in fiber, and low in saturated fat, trans fat, and cholesterol may control cholesterol levels. °¨ Controlling carbohydrates and sugar intake is recommended to manage diabetes. °¨ Reducing calorie intake and making food choices that are low in sodium, saturated fat, trans fat, and cholesterol are recommended to manage obesity. °· Maintain a healthy weight. °· Stay physically active. It is recommended that you get at least 30 minutes of  activity on all or most days. °· Do not use any tobacco products including cigarettes, chewing tobacco, or electronic cigarettes. °· Limit alcohol use even if you are not taking warfarin. Moderate alcohol use is considered to be: °¨ No more than 2 drinks each day for men. °¨ No more than 1 drink each day for nonpregnant women. °· Home safety. A safe home environment is important to reduce the risk of falls. Your health care provider may arrange for specialists to evaluate your home. Having grab bars in the bedroom and bathroom is often important. Your health care provider may arrange for equipment to be used at home, such as raised toilets and a seat for the shower. °· Physical, occupational, and speech therapy. Ongoing therapy may be needed to maximize your recovery after a stroke. If you have been advised to use a walker or a cane, use it at all times. Be sure to keep your therapy appointments. °· Follow all instructions for follow-up with your health care provider. This is very important. This includes any referrals, physical therapy, rehabilitation, and lab tests. Proper follow-up can prevent another stroke from occurring. °SEEK MEDICAL CARE IF: °· You have personality changes. °· You have difficulty swallowing. °· You are seeing double. °· You have dizziness. °· You have a fever. °· You have skin breakdown. °SEEK IMMEDIATE MEDICAL CARE IF:  °Any of these symptoms may represent a serious problem that is an emergency. Do not wait to see if the symptoms will go away. Get medical help right away. Call your local emergency services (911 in [place] Do not drive yourself to the hospital. °· You have sudden weakness or numbness of the face, arm, or leg, especially on one side of the body. °· You have sudden trouble walking or difficulty moving arms or legs. °· You have sudden confusion. °· You have trouble speaking (aphasia) or understanding. °· You have sudden trouble seeing in one or both eyes. °· You have a loss of  balance or coordination. °· You have a sudden, severe headache with no known cause. °· You have new chest pain or an irregular heartbeat. °· You have a partial or total loss of consciousness. °Document Released: 11/02/2005 Document Revised: 03/19/2014 Document Reviewed: 06/12/2012 °[employer]® Patient Information ©2015 [employer]. This information is not intended to replace advice given to you by your health care provider. Make sure you discuss any questions you have with your health care provider. ° °

## 2015-03-03 NOTE — ED Notes (Signed)
The pt is c/o high bp.  The pt just left the ed a few hours ago  And his bp has climbed since then.  Past  Stroke with deficits

## 2015-03-03 NOTE — Progress Notes (Signed)
STROKE TEAM PROGRESS NOTE   HISTORY Alejandro [name] is an 74 y.o. Male with hypertension, hyperlipidemia, diabetes mellitus, kidney disease and previous cerebral infarction with left hemiparesis presenting with slurred speech and confusion as well as worsening of left-sided weakness. Onset is unclear. Family has noticed speech changes and confusion over the past couple days with worsening on the afternoon of 14 2016. CT scan of his head showed chronic small vessel changes with no acute findings. MRI showed a small left corpus callosum acute ischemic infarction. Patient has been taking aspirin daily for antiplatelet therapy. NIH stroke score was 7.  LSN: Unclear tPA Given: No: Unclear when last known well mRankin:  SUBJECTIVE (INTERVAL HISTORY) The patient's wife and daughter are not at the bedside.  Echo and dopplers are ok. OBJECTIVE Temp:  [97.6 F (36.4 C)-98.7 F (37.1 C)] 97.9 F (36.6 C) (04/17 0943) Pulse Rate:  [73-91] 87 (04/17 0943) Cardiac Rhythm:  [-] Normal sinus rhythm (04/16 2015) Resp:  [18-20] 18 (04/17 0943) BP: (120-196)/(59-133) 191/107 mmHg (04/17 0943) SpO2:  [97 %-99 %] 97 % (04/17 0943)   Recent Labs Lab 03/02/15 0632 03/02/15 1127 03/02/15 1608 03/03/15 0640 03/03/15 1108  GLUCAP 131* 288* 207* 86 194*    Recent Labs Lab 02/28/15 2029 03/02/15 0650 03/02/15 1550 03/03/15 0630  NA 139 137  --  139  K 3.3* 2.8*  --  3.5  CL 105 103  --  106  CO2 25 25  --  24  GLUCOSE 275* 120*  --  99  BUN 18 11  --  12  CREATININE 1.30 1.13  --  1.15  CALCIUM 8.9 8.7  --  8.9  MG  --   --  1.8  --     Recent Labs Lab 02/28/15 2029  AST 22  ALT 18  ALKPHOS 74  BILITOT 0.5  PROT 7.2  ALBUMIN 3.6    Recent Labs Lab 02/28/15 2029 03/02/15 0650  WBC 10.0 6.8  NEUTROABS 7.0  --   HGB 14.4 13.7  HCT 42.9 41.1  MCV 77.6* 77.5*  PLT 168 166   No results for input(s): CKTOTAL, CKMB, CKMBINDEX, TROPONINI in the last 168 hours.  Recent Labs   02/28/15 2029  LABPROT 14.5  INR 1.11    Recent Labs  03/01/15 0032  COLORURINE YELLOW  LABSPEC 1.016  PHURINE 6.0  GLUCOSEU 250*  HGBUR TRACE*  BILIRUBINUR NEGATIVE  KETONESUR NEGATIVE  PROTEINUR 100*  UROBILINOGEN 1.0  NITRITE NEGATIVE  LEUKOCYTESUR NEGATIVE       Component Value Date/Time   CHOL 177 03/01/2015 0657   TRIG 107 03/01/2015 0657   HDL 40 03/01/2015 0657   CHOLHDL 4.4 03/01/2015 0657   VLDL 21 03/01/2015 0657   LDLCALC 116* 03/01/2015 0657   Lab Results  Component Value Date   HGBA1C 10.7* 03/01/2015   No results found for: LABOPIA, COCAINSCRNUR, LABBENZ, AMPHETMU, THCU, LABBARB   Recent Labs Lab 03/01/15 0016  ETH <5   I have personally reviewed the radiological images below and agree with the radiology interpretations.  Dg Chest 2 View 02/28/2015    1. Stable bronchial thickening.  2. Mild progression cardiomegaly and tortuous thoracic aorta from remote prior exam.     Ct Head Wo Contrast  02/28/2015    1. No acute intracranial findings.  2. Stable pattern of advanced chronic vessel disease; extensive subcortical changes could easily obscure a lacunar infarct.  3. Chronic sinusitis with polypoid features.    Mr Maxine Glenn  Head Wo Contrast 02/28/2015    MRI HEAD IMPRESSION:   1. 6 mm acute ischemic infarct involving the left aspect of the anterior genu of the corpus callosum. No associated hemorrhage or significant mass effect.  2. No other acute intracranial process identified.  3. Cerebral atrophy with moderate to severe chronic microvascular ischemic disease involving the periventricular white matter and pons.    MRA HEAD IMPRESSION:   1. Limited study due to motion artifact. No acute proximal branch arterial occlusion or definite hemodynamically significant stenosis identified.  2. Nonvisualization of the left vertebral artery, likely occluded in the neck. Right vertebral artery is patent but with moderate multi focal atheromatous irregularity  and stenoses.  3. Distal branch atheromatous irregularity within the PCA branches bilaterally.  4. Fetal origin of the right PCA.     2-D echocardiogram 03/01/2015 Study Conclusions - Left ventricle: The cavity size was normal. There was mild concentric hypertrophy. Systolic function was normal. The estimated ejection fraction was in the range of 55% to 60%. Wall motion was normal; there were no regional wall motion abnormalities. Doppler parameters are consistent with abnormal left ventricular relaxation (grade 1 diastolic dysfunction).  EEG 03/01/2015 Impression: This predominantly drowsy and asleep EEG is normal.  A normal EEG does not exclude a clinical diagnosis of epilepsy. Clinical correlation is advised.  CUS -BIlateral: intimal wall thickening CCA. Mild mixed plaque origin ICA. 1-39% ICA stenosis. Vertbral artery flow is antegrade.   PHYSICAL EXAM  Temp:  [97.6 F (36.4 C)-98.7 F (37.1 C)] 97.9 F (36.6 C) (04/17 0943) Pulse Rate:  [73-91] 87 (04/17 0943) Resp:  [18-20] 18 (04/17 0943) BP: (120-196)/(59-133) 191/107 mmHg (04/17 0943) SpO2:  [97 %-99 %] 97 % (04/17 0943)  General - Well nourished, well developed, in no apparent distress.  Ophthalmologic - fundi not visualized due to incorporation.  Cardiovascular - Regular rate and rhythm.  Mental Status -  Awake, alert, orientated to months, person, place and the president, not orientated to year. Language including expression, naming, repetition, comprehension was assessed and found intact. Psychological impersistence.  Cranial Nerves II - XII - II - Visual field intact OU. III, IV, VI - Extraocular movements intact. V - Facial sensation decreased on the left. VII - left facial droop. VIII - Hearing & vestibular intact bilaterally. X - Palate elevates symmetrically, mild to moderate dysarthria. XI - Chin turning & shoulder shrug intact bilaterally. XII - Tongue protrusion to the  left.  Motor Strength - The patient's strength was 5/5 RUE and RLE, but 3/5 LUE proximally and 0/5 distally, 4/5 LLE.  Bulk was normal and fasciculations were absent.   Motor Tone - Muscle tone was assessed at the neck and appendages and was normal.  Reflexes - The patient's reflexes were 1+ in all extremities and he had no pathological reflexes.  Sensory - Light touch, temperature/pinprick were assessed and were decreased on the left.    Coordination - The patient had normal movements in the right hand with no ataxia or dysmetria.  Tremor was present intermittently on the left upper extremity.  Gait and Station -  not tested due to safety concerns.  ASSESSMENT/PLAN Alejandro [name] is a 74 y.o. male with history of hypertension, hyperlipidemia, diabetes mellitus, kidney disease and previous cerebral infarction with left hemiparesis presenting with slurred speech and confusion as well as worsening of left-sided weakness. He did not receive IV t-PA due to unknown time of onset.  Encephalopathy, hypertensive vs. delirium   Patient presenting symptoms  consistent with encephalopathy  Blood pressure very high, uncontrolled so far  According to family, patient has symptoms and signs of dementia  Recommend to gradually control blood pressure to normotensive in 5-7 days  EEG showed no seizure  Stroke:  Dominant 6 mm acute ischemic infarct involving the left aspect of the anterior genu of the corpus callosum, incidental finding, likely due to small vessel disease.  MRI  as above  MRA - Nonvisualization of the left vertebral artery, likely occluded in the neck. Right vertebral artery is patent but with moderate multi focal atheromatous irregularity and stenoses.   Carotid Doppler -  No significant stenosis  2D Echo  EF 55-60%. No cardiac source of emboli noted.  LDL - 116, not at goal  HgbA1c - 10.7  EEG - normal  Subcutaneous heparin for VTE prophylaxis Diet Carb Modified  Fluid consistency:: Thin; Room service appropriate?: Yes  aspirin 81 mg orally every day prior to admission, now on aspirin 325 mg orally every day  Ongoing aggressive stroke risk factor management  Therapy recommendations: Home health physical therapy recommended.  Disposition:  SNF  Hypertension  Home meds: Cozaar 100 mg daily  Permissive hypertension <220/120 for 24-48 hours and then gradually normalize within 5-7 days   Currently significant elevated, especially the DBP  Will resume losartan at one half home dose.   Hyperlipidemia  Home meds:  Lipitor 10 mg daily - resumed in hospital  LDL 116, goal < 70  Increase Lipitor to 40 mg daily  Continue statin at discharge  Diabetes  HgbA1c 10.7, goal < 7.0  Uncontrolled  Other Stroke Risk Factors  Advanced age  Cigarette smoker, quit smoking  Obesity, Body mass index is 28.76 kg/(m^2).   Hx stroke/TIA  Obstructive sleep apnea per family - needs Outpatient sleep study  Other Active Problems  Hypokalemia - supplementation ordered  Early dementia  Possible left upper extremity focal seizures - negative EEG  Possible obstructive sleep apnea - plan outpatient sleep study   PLAN  Per Dr. Pearlean Brownie change aspirin to Plavix  Await carotid Dopplers  Should be able to discharge soon  Outpatient sleep study  Needs better glucose control  Gradually normalize blood pressure (losartan should help with potassium correction)  Continue Lipitor at discharge  Supplement potassium as needed  Follow-up Dr. Pearlean Brownie in 2 months  Hospital day # 3  Delton See PA-C [facility] Pager [phone] 03/03/2015, 11:50 AM  I, the attending vascular neurologist, have personally obtained a history, examined the patient, evaluated laboratory data, individually viewed imaging studies and agree with radiology interpretations. I also obtained additional history from pt's daughter and wife at bedside. Together  with the NP/PA, we formulated the assessment and plan of care which reflects our mutual decision.  I have made any additions or clarifications directly to the above note and agree with the findings and plan as currently documented.   74 year old male with previous stroke 15 years ago with left hemiparesis, hypertension, hyperlipidemia, diabetes, CKD was admitted for one day history of altered mental status, not eating well, shuffling gait, slow responses, confusion. Symptoms consistent with encephalopathy, in the setting of significant elevation of blood pressure and cognitive impairment. MRI showed incidental finding of punctate left ACA infarct, likely due to small vessel disease. Increase aspirin to 325, continue antiplatelet and statin for stroke prevention. Aggressive management of risk factors.  Stroke team will sign off. Call for questions.  Delia Heady, MD       To  contact Stroke Continuity provider, please refer to [email or web address] After hours, contact General Neurology

## 2015-03-03 NOTE — ED Notes (Signed)
Not sure if he just left today

## 2015-03-03 NOTE — Evaluation (Signed)
Occupational Therapy Evaluation Patient Details Name: Johnattan Strassman MRN: [id number] DOB: 17-Jan-1941 Today's Date: 03/03/2015    History of Present Illness Pt is a 74 y.o. male admitted 02/28/15 with AMS, slurred speech and worsening weakness.  MRI/MRA showed infarct in anterior genu of corpus callosum and likely occluded left vertebral artery. Patient with PMH of hypertension, hyperlipidemia, poorly controlled diabetes, chronic kidney disease-III, history of stroke with left side weakness.   Clinical Impression   PTA pt lived at home with his spouse. Pt reports independence with ADLs with use of SPC and family does report he recently needed assist with ADLs due to R hand injury. Pt required min A/min guard to transfer to recliner and family reports this is close to baseline. Pt will benefit from acute OT to progress to Supervision level and will benefit from [facility].    Follow Up Recommendations  Home health OT;Supervision/Assistance - 24 hour    Equipment Recommendations  None recommended by OT    Recommendations for Other Services       Precautions / Restrictions Precautions Precautions: Fall Restrictions Weight Bearing Restrictions: No      Mobility Bed Mobility Overal bed mobility: Modified Independent             General bed mobility comments: Pt able to use LUE to assist himself to sit up in bed.   Transfers Overall transfer level: Needs assistance Equipment used: Straight cane Transfers: Sit to/from Stand Sit to Stand: Min assist         General transfer comment: Min A from daughter under L arm pit. She reports this is baseline.          ADL Overall ADL's : Needs assistance/impaired                                       General ADL Comments: Pt required assist from daughter to stand and min guard with use of SPC to transfer to recliner. Pt turned suddenly and began to sit prematurely. Family (wife and daughter) present and they  report that pt appears to be at baseline. Family appears to have knowledge of how to assist pt from working with therapy previously after his stroke.                Pertinent Vitals/Pain Pain Assessment: No/denies pain     Hand Dominance Right   Extremity/Trunk Assessment Upper Extremity Assessment Upper Extremity Assessment: LUE deficits/detail LUE Deficits / Details: LUE weakness with fixed scapula from prior stroke. Pt able to elevate L shoulder but unable to retract. Spasticity noted in L bicep. Pt with L hand contractures but able to extend fingers. Impairments are baseline for pt.    Lower Extremity Assessment Lower Extremity Assessment: Defer to PT evaluation   Cervical / Trunk Assessment Cervical / Trunk Assessment: Normal   Communication Communication Communication: Other (comment) (mild expressive difficulties)   Cognition Arousal/Alertness: Awake/alert Behavior During Therapy: WFL for tasks assessed/performed Overall Cognitive Status: History of cognitive impairments - at baseline                                Home Living Family/patient expects to be discharged to:: Private residence Living Arrangements: Spouse/significant other Available Help at Discharge: Family;Friend(s);Available 24 hours/day (daughter assists) Type of Home: House Home Access: Level entry     Home Layout: One  level     Bathroom Shower/Tub: Chief Strategy OfficerTub/shower unit   Bathroom Toilet: Handicapped height     Home Equipment: Cane - single point          Prior Functioning/Environment Level of Independence: Independent with assistive device(s)  Gait / Transfers Assistance Needed: uses cane for ambulation ADL's / Homemaking Assistance Needed: recently needs assistance with dressing as he injured his right hand        OT Diagnosis: Generalized weakness   OT Problem List: Decreased strength;Decreased activity tolerance;Impaired balance (sitting and/or standing);Impaired UE  functional use   OT Treatment/Interventions: Self-care/ADL training;Therapeutic exercise;Neuromuscular education;Energy conservation;DME and/or AE instruction;Therapeutic activities;Patient/family education;Balance training    OT Goals(Current goals can be found in the care plan section) Acute Rehab OT Goals Patient Stated Goal: go back home OT Goal Formulation: With patient Time For Goal Achievement: 03/17/15 Potential to Achieve Goals: Good ADL Goals Pt Will Perform Lower Body Bathing: with supervision;sit to/from stand Pt Will Perform Lower Body Dressing: with supervision;sit to/from stand Pt Will Transfer to Toilet: with supervision;ambulating Pt Will Perform Toileting - Clothing Manipulation and hygiene: with supervision;sit to/from stand  OT Frequency: Min 2X/week    End of Session Equipment Utilized During Treatment: Other (comment) (SPC)  Activity Tolerance: Patient tolerated treatment well Patient left: in chair;with call bell/phone within reach;with chair alarm set;with family/visitor present   Time: [id number] OT Time Calculation (min): 15 min Charges:  OT General Charges $OT Visit: 1 Procedure OT Evaluation $Initial OT Evaluation Tier I: 1 Procedure G-Codes:    Rae LipsMiller, Lizann Edelman M 03/03/2015, 12:37 PM  Carney LivingLeeAnn Marie Mee Macdonnell, OTR/L Occupational Therapist [phone] (pager)

## 2015-03-03 NOTE — Progress Notes (Signed)
Pt d/c to home by car with family. Assessment stable. Prescriptions given. Pt and family verbalize understanding of d/c instructions. 

## 2015-03-04 ENCOUNTER — Telehealth: Payer: Self-pay | Admitting: Surgery

## 2015-03-04 MED ORDER — AMLODIPINE BESYLATE 5 MG PO TABS
10.0000 mg | ORAL_TABLET | Freq: Once | ORAL | Status: AC
  Administered 2015-03-04: 10 mg via ORAL
  Filled 2015-03-04: qty 2

## 2015-03-04 MED ORDER — LABETALOL HCL 5 MG/ML IV SOLN
10.0000 mg | Freq: Once | INTRAVENOUS | Status: AC
  Administered 2015-03-04: 10 mg via INTRAVENOUS
  Filled 2015-03-04: qty 4

## 2015-03-04 NOTE — Progress Notes (Signed)
Apparently pt had not been able to start on amlodipine as per discharge summary,  Pt is asyptomatic,  ED will try to achieve better bp control and give first dose of amlodipine and if bp improves discharge to home w/ follow up as per discharge summary

## 2015-03-04 NOTE — Telephone Encounter (Signed)
ED CM received call from patient's daughter Lurena JoinerRebecca concerning her dad who was seen last night in the ED for hypertension. Patient was recently discharged from the hospital s/p CVA. Daughter is concerned about patient having a second stroke she states his B/P is high and she does not know why he was released from the hospital. ED CM reviewed patient's record, discussed that based on the clinical review patient B/P are over 200/100's she should bring him in to the ED, she states that he may have to go home based on what you have explained about criteria for admission.  I reiterated that an ED evaluation would be needed to determine a disposition. Daughter verbalized understanding, she said that she was on her way to recheck his b/p. She stated, [facility] to figure out what to do.

## 2015-03-04 NOTE — Discharge Instructions (Signed)

## 2015-03-04 NOTE — Telephone Encounter (Addendum)
ED CM contacted daughter to f/u on patient. Offered after care interactive patient education about CVA on [email or web address]. Daughter was very Adult nurseappreciative, will send via text tomorrow . She stated that she rechecked the b/p and will wait for the [facility] nurse who will be out in the morning.  Made daughter if things should worsen through out the night she should bring him back to the ED., she verbalized understanding teach back done. No further ED CM needs identified.

## 2015-05-22 ENCOUNTER — Ambulatory Visit: Payer: Self-pay | Admitting: Neurology

## 2015-05-24 ENCOUNTER — Ambulatory Visit: Payer: Self-pay | Admitting: Neurology

## 2015-09-21 ENCOUNTER — Ambulatory Visit (INDEPENDENT_AMBULATORY_CARE_PROVIDER_SITE_OTHER): Payer: Medicare Other | Admitting: Family Medicine

## 2015-09-21 VITALS — BP 132/80 | HR 97 | Temp 97.5°F | Resp 17 | Ht 71.5 in | Wt 230.0 lb

## 2015-09-21 DIAGNOSIS — Z794 Long term (current) use of insulin: Secondary | ICD-10-CM

## 2015-09-21 DIAGNOSIS — M25552 Pain in left hip: Secondary | ICD-10-CM

## 2015-09-21 DIAGNOSIS — I639 Cerebral infarction, unspecified: Secondary | ICD-10-CM | POA: Diagnosis not present

## 2015-09-21 DIAGNOSIS — I63311 Cerebral infarction due to thrombosis of right middle cerebral artery: Secondary | ICD-10-CM | POA: Diagnosis not present

## 2015-09-21 DIAGNOSIS — E119 Type 2 diabetes mellitus without complications: Secondary | ICD-10-CM

## 2015-09-21 LAB — COMPREHENSIVE METABOLIC PANEL
ALT: 20 U/L (ref 9–46)
AST: 21 U/L (ref 10–35)
Albumin: 3.8 g/dL (ref 3.6–5.1)
Alkaline Phosphatase: 73 U/L (ref 40–115)
BUN: 25 mg/dL (ref 7–25)
CO2: 28 mmol/L (ref 20–31)
Calcium: 9.3 mg/dL (ref 8.6–10.3)
Chloride: 102 mmol/L (ref 98–110)
Creat: 1.33 mg/dL — ABNORMAL HIGH (ref 0.70–1.18)
Glucose, Bld: 292 mg/dL — ABNORMAL HIGH (ref 65–99)
Potassium: 3.5 mmol/L (ref 3.5–5.3)
Sodium: 140 mmol/L (ref 135–146)
Total Bilirubin: 0.6 mg/dL (ref 0.2–1.2)
Total Protein: 7.1 g/dL (ref 6.1–8.1)

## 2015-09-21 LAB — POCT URINALYSIS DIP (MANUAL ENTRY)
Bilirubin, UA: NEGATIVE
Glucose, UA: 250 — AB
Ketones, POC UA: NEGATIVE
Leukocytes, UA: NEGATIVE
Nitrite, UA: NEGATIVE
Protein Ur, POC: >=300 — AB
Spec Grav, UA: 1.02
Urobilinogen, UA: 0.2
pH, UA: 5.5

## 2015-09-21 LAB — GLUCOSE, POCT (MANUAL RESULT ENTRY): POC Glucose: 297 mg/dl — AB (ref 70–99)

## 2015-09-21 LAB — HEMOGLOBIN A1C: HEMOGLOBIN A1C: 11.6 % — AB (ref 4.0–6.0)

## 2015-09-21 LAB — POCT GLYCOSYLATED HEMOGLOBIN (HGB A1C): Hemoglobin A1C: 11.6

## 2015-09-21 MED ORDER — INSULIN GLARGINE 100 UNIT/ML ~~LOC~~ SOLN: 40.0000 [IU] | Freq: Two times a day (BID) | SUBCUTANEOUS | Status: DC

## 2015-09-21 MED ORDER — INSULIN GLARGINE 300 UNIT/ML ~~LOC~~ SOPN: 70.0000 [IU] | PEN_INJECTOR | Freq: Every day | SUBCUTANEOUS | Status: AC

## 2015-09-21 NOTE — Progress Notes (Signed)
This chart was scribed for Elvina Sidle, MD by Stann Ore, medical scribe at [facility].The patient was seen in exam room 11 and the patient's care was started at 1:08 PM.  Patient ID: Alejandro [name] MRN: [id number], DOB: 10-17-1941, 74 y.o. Date of Encounter: 09/21/2015  Primary Physician: Dorrene German, MD  Chief Complaint:  Chief Complaint  Patient presents with   Medication Refill    Has been out of insulin since Thursday   Blood Sugar Problem    Hasnt checked sugar ina while-wife takes care of meds & she is in rehab right now    HPI:  Alejandro [name] is a 74 y.o. male who presents to [facility] for medication refill.  He ran out insulin since 2 days ago. He takes 50/50 insulin a day, and toujeo 70 at night. His insurance doesn't cover humalog. He's had diabetes less than 10 years. He denies vision loss, thirsty, unexpected weight changes. He takes potassium, 3 BP medications and an additional pill to help his sugar. He has urinary incontinence. His sugar has fallen down below 100 earlier this week. He does not like to walk because he has history of syncope and collapsing.   He also notes having left hip pain. He previously had a stroke with left side deficits. He had a mild stroke in the middle of his head earlier this year. His daughter notes the pt having some memory loss with mild dementia.   He was brought in by his daughter today. His wife has been hospitalized for the past 3 weeks.   Past Medical History  Diagnosis Date   Stroke [facility]) 2000    left side deficits   Uncontrolled diabetes mellitus (HCC)    Left hemiparesis (HCC)    Hypertension    Diabetic neuropathy (HCC)    Chronic progressive renal failure, stage 3 (moderate)    HLD (hyperlipidemia)    Asthma      Home Meds: Prior to Admission medications   Medication Sig Start Date End Date Taking? Authorizing Provider  amLODipine (NORVASC) 10 MG  tablet Take 10 mg by mouth daily.   Yes Historical Provider, MD  atorvastatin (LIPITOR) 40 MG tablet Take 1 tablet (40 mg total) by mouth daily. 03/03/15  Yes Jerald Kief, MD  cholecalciferol (VITAMIN D) 1000 UNITS tablet Take 2,000 Units by mouth daily.    Yes Historical Provider, MD  furosemide (LASIX) 20 MG tablet Take 20 mg by mouth daily. 09/27/14  Yes Historical Provider, MD  hydrochlorothiazide (MICROZIDE) 12.5 MG capsule Take 12.5 mg by mouth daily.   Yes Historical Provider, MD  Insulin Glargine (TOUJEO SOLOSTAR) 300 UNIT/ML SOPN Inject 70 Units into the skin at bedtime.    Yes Historical Provider, MD  insulin lispro protamine-lispro (HUMALOG 50/50 MIX) (50-50) 100 UNIT/ML SUSP injection Inject 60 Units into the skin 3 (three) times daily with meals.    Yes Historical Provider, MD  losartan (COZAAR) 100 MG tablet Take 100 mg by mouth daily.   Yes Historical Provider, MD  Multiple Minerals (CALCIUM-MAGNESIUM-ZINC) TABS Take 1 tablet by mouth daily.   Yes Historical Provider, MD  Multiple Vitamins-Minerals (MULTIVITAMIN WITH MINERALS) tablet Take 1 tablet by mouth daily.   Yes Historical Provider, MD  potassium chloride (K-DUR,KLOR-CON) 10 MEQ tablet Take 2 tablets (20 mEq total) by mouth daily. Patient taking differently: Take 10 mEq by mouth daily.  12/02/13  Yes Costin Otelia Sergeant, MD  clopidogrel (PLAVIX) 75 MG tablet  Take 1 tablet (75 mg total) by mouth daily. Patient not taking: Reported on 09/21/2015 03/03/15   Jerald KiefStephen K Chiu, MD    Allergies:  Allergies  Allergen Reactions   Coumadin [Warfarin Sodium] Palpitations    Heart races   Penicillins Hives and Anaphylaxis    Social History   Social History   Marital Status: Married    Spouse Name: N/A   Number of Children: N/A   Years of Education: N/A   Occupational History   Not on file.   Social History Main Topics   Smoking status: Former Smoker   Smokeless tobacco: Never Used   Alcohol Use: No   Drug Use: No     Sexual Activity: No   Other Topics Concern   Not on file   Social History Narrative     Review of Systems: Constitutional: negative for fever, chills, night sweats, weight changes, or fatigue  HEENT: negative for vision changes, hearing loss, congestion, rhinorrhea, ST, epistaxis, or sinus pressure Cardiovascular: negative for chest pain or palpitations Respiratory: negative for hemoptysis, wheezing, shortness of breath, or cough Abdominal: negative for abdominal pain, nausea, vomiting, diarrhea, or constipation Dermatological: negative for rash Neurologic: negative for headache, dizziness, or syncope GU: positive for urinary incontinence All other systems reviewed and are otherwise negative with the exception to those above and in the HPI.  Physical Exam: Blood pressure 132/80, pulse 97, temperature 97.5 F (36.4 C), temperature source Oral, resp. rate 17, height 5' 11.5" (1.816 m), weight 230 lb (104.327 kg), SpO2 97 %., Body mass index is 31.63 kg/(m^2). General: Well developed, well nourished, in no acute distress. Head: Normocephalic, atraumatic, eyes without discharge, sclera non-icteric, nares are without discharge. Bilateral auditory canals clear, TM's are without perforation, pearly grey and translucent with reflective cone of light bilaterally. Oral cavity moist, posterior pharynx without exudate, erythema, peritonsillar abscess, or post nasal drip.  Neck: Supple. No thyromegaly. Full ROM. No lymphadenopathy. Lungs: Clear bilaterally to auscultation without wheezes, rales, or rhonchi. Breathing is unlabored. Heart: RRR with S1 S2. No murmurs, rubs, or gallops appreciated. Abdomen: Soft, non-tender, non-distended with normoactive bowel sounds. No hepatomegaly. No rebound/guarding. No obvious abdominal masses. Msk:  Strength and tone normal for age. Extremities/Skin: Warm and dry. No clubbing or cyanosis. No edema. No rashes or suspicious lesions. Neuro: Alert and oriented  X 3. CNII-XII grossly in tact.; uses a cane for mobility, hemiparalysis on left Psych:  Responds to questions appropriately with a normal affect.   Labs: Results for orders placed or performed in visit on 09/21/15  POCT glucose (manual entry)  Result Value Ref Range   POC Glucose 297 (A) 70 - 99 mg/dl  POCT glycosylated hemoglobin (Hb A1C)  Result Value Ref Range   Hemoglobin A1C 11.6   POCT urinalysis dipstick  Result Value Ref Range   Color, UA yellow yellow   Clarity, UA clear clear   Glucose, UA =250 (A) negative   Bilirubin, UA negative negative   Ketones, POC UA negative negative   Spec Grav, UA 1.020    Blood, UA trace-lysed (A) negative   pH, UA 5.5    Protein Ur, POC >=300 (A) negative   Urobilinogen, UA 0.2    Nitrite, UA Negative Negative   Leukocytes, UA Negative Negative     ASSESSMENT AND PLAN:  74 y.o. year old male with poorly controlled diabetes with marked fluctuations of glucose and inability to afford current insulin.  This chart was scribed in my presence and reviewed  by me personally.    ICD-9-CM ICD-10-CM   1. Type 2 diabetes mellitus without complication, with long-term current use of insulin (HCC) 250.00 E11.9 POCT glucose (manual entry)   V58.67 Z79.4 POCT glycosylated hemoglobin (Hb A1C)     Comprehensive metabolic panel     POCT urinalysis dipstick     insulin glargine (LANTUS) 100 UNIT/ML injection  2. Left hip pain 719.45 M25.552 CANCELED: DG HIP UNILAT W OR W/O PELVIS 2-3 VIEWS LEFT  3. Cerebrovascular accident (CVA) due to thrombosis of right middle cerebral artery [facility]) 434.01 I63.311      Signed, Elvina Sidle, MD    By signing my name below, I, Stann Ore, attest that this documentation has been prepared under the direction and in the presence of Elvina Sidle, MD. Electronically Signed: Stann Ore, Scribe. 09/21/2015 , 1:54 PM .  Signed, Elvina Sidle, MD 09/21/2015 1:54 PM

## 2015-09-21 NOTE — Addendum Note (Signed)
Addended by: Elvina SidleLAUENSTEIN, Isaiah Cianci on: 09/21/2015 01:58 PM   Modules accepted: Level of Service

## 2015-09-25 ENCOUNTER — Encounter: Payer: Self-pay | Admitting: Family Medicine

## 2016-07-30 LAB — HM DIABETES EYE EXAM

## 2016-09-30 ENCOUNTER — Encounter: Payer: Self-pay | Admitting: *Deleted

## 2016-10-22 ENCOUNTER — Other Ambulatory Visit: Payer: Self-pay

## 2017-09-11 ENCOUNTER — Emergency Department (HOSPITAL_COMMUNITY): Payer: Medicare Other

## 2017-09-11 ENCOUNTER — Inpatient Hospital Stay (HOSPITAL_COMMUNITY)
Admission: EM | Admit: 2017-09-11 | Discharge: 2017-10-16 | DRG: 871 | Disposition: E | Payer: Medicare Other | Attending: Pulmonary Disease | Admitting: Pulmonary Disease

## 2017-09-11 ENCOUNTER — Encounter (HOSPITAL_COMMUNITY): Payer: Self-pay

## 2017-09-11 DIAGNOSIS — Z9911 Dependence on respirator [ventilator] status: Secondary | ICD-10-CM | POA: Diagnosis not present

## 2017-09-11 DIAGNOSIS — J9601 Acute respiratory failure with hypoxia: Secondary | ICD-10-CM | POA: Diagnosis not present

## 2017-09-11 DIAGNOSIS — Z931 Gastrostomy status: Secondary | ICD-10-CM

## 2017-09-11 DIAGNOSIS — E1122 Type 2 diabetes mellitus with diabetic chronic kidney disease: Secondary | ICD-10-CM | POA: Diagnosis present

## 2017-09-11 DIAGNOSIS — D649 Anemia, unspecified: Secondary | ICD-10-CM | POA: Diagnosis present

## 2017-09-11 DIAGNOSIS — Z87891 Personal history of nicotine dependence: Secondary | ICD-10-CM

## 2017-09-11 DIAGNOSIS — I952 Hypotension due to drugs: Secondary | ICD-10-CM | POA: Diagnosis not present

## 2017-09-11 DIAGNOSIS — E872 Acidosis: Secondary | ICD-10-CM | POA: Diagnosis present

## 2017-09-11 DIAGNOSIS — J181 Lobar pneumonia, unspecified organism: Secondary | ICD-10-CM | POA: Diagnosis not present

## 2017-09-11 DIAGNOSIS — E1101 Type 2 diabetes mellitus with hyperosmolarity with coma: Secondary | ICD-10-CM | POA: Diagnosis not present

## 2017-09-11 DIAGNOSIS — E876 Hypokalemia: Secondary | ICD-10-CM | POA: Diagnosis present

## 2017-09-11 DIAGNOSIS — Z9114 Patient's other noncompliance with medication regimen: Secondary | ICD-10-CM

## 2017-09-11 DIAGNOSIS — G40901 Epilepsy, unspecified, not intractable, with status epilepticus: Secondary | ICD-10-CM

## 2017-09-11 DIAGNOSIS — E1165 Type 2 diabetes mellitus with hyperglycemia: Secondary | ICD-10-CM | POA: Diagnosis present

## 2017-09-11 DIAGNOSIS — E860 Dehydration: Secondary | ICD-10-CM | POA: Diagnosis present

## 2017-09-11 DIAGNOSIS — Z794 Long term (current) use of insulin: Secondary | ICD-10-CM

## 2017-09-11 DIAGNOSIS — I129 Hypertensive chronic kidney disease with stage 1 through stage 4 chronic kidney disease, or unspecified chronic kidney disease: Secondary | ICD-10-CM | POA: Diagnosis present

## 2017-09-11 DIAGNOSIS — E874 Mixed disorder of acid-base balance: Secondary | ICD-10-CM | POA: Diagnosis present

## 2017-09-11 DIAGNOSIS — R001 Bradycardia, unspecified: Secondary | ICD-10-CM | POA: Diagnosis not present

## 2017-09-11 DIAGNOSIS — Z823 Family history of stroke: Secondary | ICD-10-CM

## 2017-09-11 DIAGNOSIS — R1110 Vomiting, unspecified: Secondary | ICD-10-CM

## 2017-09-11 DIAGNOSIS — Z833 Family history of diabetes mellitus: Secondary | ICD-10-CM

## 2017-09-11 DIAGNOSIS — G40101 Localization-related (focal) (partial) symptomatic epilepsy and epileptic syndromes with simple partial seizures, not intractable, with status epilepticus: Secondary | ICD-10-CM | POA: Diagnosis present

## 2017-09-11 DIAGNOSIS — G92 Toxic encephalopathy: Secondary | ICD-10-CM | POA: Diagnosis present

## 2017-09-11 DIAGNOSIS — Z8249 Family history of ischemic heart disease and other diseases of the circulatory system: Secondary | ICD-10-CM

## 2017-09-11 DIAGNOSIS — N183 Chronic kidney disease, stage 3 (moderate): Secondary | ICD-10-CM | POA: Diagnosis present

## 2017-09-11 DIAGNOSIS — J9690 Respiratory failure, unspecified, unspecified whether with hypoxia or hypercapnia: Secondary | ICD-10-CM

## 2017-09-11 DIAGNOSIS — Z880 Allergy status to penicillin: Secondary | ICD-10-CM

## 2017-09-11 DIAGNOSIS — I639 Cerebral infarction, unspecified: Secondary | ICD-10-CM | POA: Diagnosis not present

## 2017-09-11 DIAGNOSIS — N390 Urinary tract infection, site not specified: Secondary | ICD-10-CM | POA: Diagnosis present

## 2017-09-11 DIAGNOSIS — F0390 Unspecified dementia without behavioral disturbance: Secondary | ICD-10-CM | POA: Diagnosis present

## 2017-09-11 DIAGNOSIS — Z7902 Long term (current) use of antithrombotics/antiplatelets: Secondary | ICD-10-CM

## 2017-09-11 DIAGNOSIS — Z66 Do not resuscitate: Secondary | ICD-10-CM | POA: Diagnosis present

## 2017-09-11 DIAGNOSIS — E1011 Type 1 diabetes mellitus with ketoacidosis with coma: Secondary | ICD-10-CM

## 2017-09-11 DIAGNOSIS — A419 Sepsis, unspecified organism: Secondary | ICD-10-CM | POA: Diagnosis not present

## 2017-09-11 DIAGNOSIS — R739 Hyperglycemia, unspecified: Secondary | ICD-10-CM

## 2017-09-11 DIAGNOSIS — Z888 Allergy status to other drugs, medicaments and biological substances status: Secondary | ICD-10-CM

## 2017-09-11 DIAGNOSIS — Z515 Encounter for palliative care: Secondary | ICD-10-CM | POA: Diagnosis present

## 2017-09-11 DIAGNOSIS — E785 Hyperlipidemia, unspecified: Secondary | ICD-10-CM | POA: Diagnosis present

## 2017-09-11 DIAGNOSIS — E1140 Type 2 diabetes mellitus with diabetic neuropathy, unspecified: Secondary | ICD-10-CM | POA: Diagnosis present

## 2017-09-11 DIAGNOSIS — I6340 Cerebral infarction due to embolism of unspecified cerebral artery: Secondary | ICD-10-CM | POA: Diagnosis present

## 2017-09-11 DIAGNOSIS — R6521 Severe sepsis with septic shock: Secondary | ICD-10-CM | POA: Diagnosis not present

## 2017-09-11 DIAGNOSIS — I69354 Hemiplegia and hemiparesis following cerebral infarction affecting left non-dominant side: Secondary | ICD-10-CM

## 2017-09-11 DIAGNOSIS — T41295A Adverse effect of other general anesthetics, initial encounter: Secondary | ICD-10-CM | POA: Diagnosis present

## 2017-09-11 DIAGNOSIS — Z79899 Other long term (current) drug therapy: Secondary | ICD-10-CM

## 2017-09-11 DIAGNOSIS — N179 Acute kidney failure, unspecified: Secondary | ICD-10-CM | POA: Diagnosis not present

## 2017-09-11 DIAGNOSIS — E11649 Type 2 diabetes mellitus with hypoglycemia without coma: Secondary | ICD-10-CM | POA: Diagnosis not present

## 2017-09-11 HISTORY — DX: Unspecified convulsions: R56.9

## 2017-09-11 LAB — I-STAT ARTERIAL BLOOD GAS, ED
Acid-base deficit: 6 mmol/L — ABNORMAL HIGH (ref 0.0–2.0)
Bicarbonate: 18.6 mmol/L — ABNORMAL LOW (ref 20.0–28.0)
O2 Saturation: 100 %
PCO2 ART: 34.3 mmHg (ref 32.0–48.0)
PH ART: 7.342 — AB (ref 7.350–7.450)
PO2 ART: 204 mmHg — AB (ref 83.0–108.0)
Patient temperature: 98.4
TCO2: 20 mmol/L — ABNORMAL LOW (ref 22–32)

## 2017-09-11 LAB — I-STAT VENOUS BLOOD GAS, ED
Acid-base deficit: 9 mmol/L — ABNORMAL HIGH (ref 0.0–2.0)
Bicarbonate: 22.1 mmol/L (ref 20.0–28.0)
O2 SAT: 33 %
PCO2 VEN: 73.9 mmHg — AB (ref 44.0–60.0)
PO2 VEN: 29 mmHg — AB (ref 32.0–45.0)
TCO2: 24 mmol/L (ref 22–32)
pH, Ven: 7.084 — CL (ref 7.250–7.430)

## 2017-09-11 LAB — GLUCOSE, CAPILLARY
GLUCOSE-CAPILLARY: 118 mg/dL — AB (ref 65–99)
GLUCOSE-CAPILLARY: 119 mg/dL — AB (ref 65–99)
GLUCOSE-CAPILLARY: 160 mg/dL — AB (ref 65–99)
Glucose-Capillary: 435 mg/dL — ABNORMAL HIGH (ref 65–99)
Glucose-Capillary: 376 mg/dL — ABNORMAL HIGH (ref 65–99)
Glucose-Capillary: 337 mg/dL — ABNORMAL HIGH (ref 65–99)
Glucose-Capillary: 267 mg/dL — ABNORMAL HIGH (ref 65–99)
Glucose-Capillary: 52 mg/dL — ABNORMAL LOW (ref 65–99)

## 2017-09-11 LAB — BASIC METABOLIC PANEL
ANION GAP: 12 (ref 5–15)
BUN: 27 mg/dL — ABNORMAL HIGH (ref 6–20)
CO2: 20 mmol/L — ABNORMAL LOW (ref 22–32)
Calcium: 8.2 mg/dL — ABNORMAL LOW (ref 8.9–10.3)
Chloride: 110 mmol/L (ref 101–111)
Creatinine, Ser: 2.06 mg/dL — ABNORMAL HIGH (ref 0.61–1.24)
GFR, EST AFRICAN AMERICAN: 34 mL/min — AB (ref >60)
GFR, EST NON AFRICAN AMERICAN: 29 mL/min — AB (ref >60)
Glucose, Bld: 448 mg/dL — ABNORMAL HIGH (ref 65–99)
POTASSIUM: 3.7 mmol/L (ref 3.5–5.1)
SODIUM: 142 mmol/L (ref 135–145)

## 2017-09-11 LAB — CBC WITH DIFFERENTIAL/PLATELET
Basophils Absolute: 0 10*3/uL (ref 0.0–0.1)
Basophils Relative: 0 %
EOS PCT: 0 %
Eosinophils Absolute: 0 10*3/uL (ref 0.0–0.7)
HCT: 42.7 % (ref 39.0–52.0)
Hemoglobin: 13.7 g/dL (ref 13.0–17.0)
Lymphocytes Relative: 7 %
Lymphs Abs: 1.1 10*3/uL (ref 0.7–4.0)
MCH: 25.8 pg — ABNORMAL LOW (ref 26.0–34.0)
MCHC: 32.1 g/dL (ref 30.0–36.0)
MCV: 80.4 fL (ref 78.0–100.0)
MONO ABS: 0.4 10*3/uL (ref 0.1–1.0)
MONOS PCT: 2 %
Neutro Abs: 15.5 10*3/uL — ABNORMAL HIGH (ref 1.7–7.7)
Neutrophils Relative %: 91 %
PLATELETS: 198 10*3/uL (ref 150–400)
RBC: 5.31 MIL/uL (ref 4.22–5.81)
RDW: 14.4 % (ref 11.5–15.5)
WBC: 17 10*3/uL — ABNORMAL HIGH (ref 4.0–10.5)

## 2017-09-11 LAB — COMPREHENSIVE METABOLIC PANEL
ALT: 16 U/L — AB (ref 17–63)
AST: 31 U/L (ref 15–41)
Albumin: 2.8 g/dL — ABNORMAL LOW (ref 3.5–5.0)
Alkaline Phosphatase: 87 U/L (ref 38–126)
Anion gap: 19 — ABNORMAL HIGH (ref 5–15)
BILIRUBIN TOTAL: 0.8 mg/dL (ref 0.3–1.2)
BUN: 31 mg/dL — AB (ref 6–20)
CALCIUM: 8.9 mg/dL (ref 8.9–10.3)
CO2: 19 mmol/L — ABNORMAL LOW (ref 22–32)
CREATININE: 2.37 mg/dL — AB (ref 0.61–1.24)
Chloride: 98 mmol/L — ABNORMAL LOW (ref 101–111)
GFR calc Af Amer: 29 mL/min — ABNORMAL LOW (ref >60)
GFR calc non Af Amer: 25 mL/min — ABNORMAL LOW (ref >60)
GLUCOSE: 846 mg/dL — AB (ref 65–99)
Potassium: 5.4 mmol/L — ABNORMAL HIGH (ref 3.5–5.1)
SODIUM: 136 mmol/L (ref 135–145)
TOTAL PROTEIN: 8.7 g/dL — AB (ref 6.5–8.1)

## 2017-09-11 LAB — URINALYSIS, ROUTINE W REFLEX MICROSCOPIC
Bilirubin Urine: NEGATIVE
Glucose, UA: >=500 mg/dL — AB
Ketones, ur: 5 mg/dL — AB
NITRITE: NEGATIVE
Protein, ur: 30 mg/dL — AB
Specific Gravity, Urine: 1.02 (ref 1.005–1.030)
Squamous Epithelial / LPF: NONE SEEN
pH: 5 (ref 5.0–8.0)

## 2017-09-11 LAB — AMMONIA: AMMONIA: 62 umol/L — AB (ref 9–35)

## 2017-09-11 LAB — TRIGLYCERIDES: Triglycerides: 81 mg/dL (ref <150)

## 2017-09-11 LAB — I-STAT CHEM 8, ED
BUN: 44 mg/dL — AB (ref 6–20)
CALCIUM ION: 1.05 mmol/L — AB (ref 1.15–1.40)
CREATININE: 1.8 mg/dL — AB (ref 0.61–1.24)
Chloride: 104 mmol/L (ref 101–111)
HCT: 46 % (ref 39.0–52.0)
HEMOGLOBIN: 15.6 g/dL (ref 13.0–17.0)
Potassium: 5.4 mmol/L — ABNORMAL HIGH (ref 3.5–5.1)
Sodium: 136 mmol/L (ref 135–145)
TCO2: 19 mmol/L — ABNORMAL LOW (ref 22–32)

## 2017-09-11 LAB — CBG MONITORING, ED
GLUCOSE-CAPILLARY: 511 mg/dL — AB (ref 65–99)
Glucose-Capillary: >600 mg/dL (ref 65–99)
Glucose-Capillary: >600 mg/dL (ref 65–99)

## 2017-09-11 LAB — ETHANOL: Alcohol, Ethyl (B): <10 mg/dL (ref <10)

## 2017-09-11 LAB — I-STAT CG4 LACTIC ACID, ED
Lactic Acid, Venous: 8.98 mmol/L (ref 0.5–1.9)
Lactic Acid, Venous: 4.05 mmol/L (ref 0.5–1.9)

## 2017-09-11 LAB — CK: CK TOTAL: 179 U/L (ref 49–397)

## 2017-09-11 LAB — MRSA PCR SCREENING: MRSA BY PCR: NEGATIVE

## 2017-09-11 MED ORDER — LORAZEPAM 2 MG/ML IJ SOLN
INTRAMUSCULAR | Status: AC
  Administered 2017-09-11: 2 mg
  Filled 2017-09-11: qty 1

## 2017-09-11 MED ORDER — ORAL CARE MOUTH RINSE
15.0000 mL | OROMUCOSAL | Status: DC
  Administered 2017-09-11 – 2017-09-16 (×46): 15 mL via OROMUCOSAL

## 2017-09-11 MED ORDER — SODIUM CHLORIDE 0.9 % IV SOLN: 250.0000 mL | INTRAVENOUS | Status: DC | PRN

## 2017-09-11 MED ORDER — PROPOFOL 1000 MG/100ML IV EMUL
5.0000 ug/kg/min | Freq: Once | INTRAVENOUS | Status: AC
  Administered 2017-09-11: 50 ug/kg/min via INTRAVENOUS

## 2017-09-11 MED ORDER — VANCOMYCIN HCL IN DEXTROSE 750-5 MG/150ML-% IV SOLN
750.0000 mg | INTRAVENOUS | Status: DC
  Administered 2017-09-12: 750 mg via INTRAVENOUS
  Filled 2017-09-11: qty 150

## 2017-09-11 MED ORDER — PANTOPRAZOLE SODIUM 40 MG PO PACK
40.0000 mg | PACK | Freq: Every day | ORAL | Status: DC
  Administered 2017-09-11 – 2017-09-16 (×6): 40 mg
  Filled 2017-09-11 (×6): qty 20

## 2017-09-11 MED ORDER — LEVETIRACETAM 500 MG/5ML IV SOLN
1500.0000 mg | INTRAVENOUS | Status: AC
  Administered 2017-09-11: 1500 mg via INTRAVENOUS
  Filled 2017-09-11: qty 15

## 2017-09-11 MED ORDER — PROPOFOL 1000 MG/100ML IV EMUL
INTRAVENOUS | Status: AC
  Filled 2017-09-11: qty 100

## 2017-09-11 MED ORDER — DEXTROSE 50 % IV SOLN
19.0000 mL | Freq: Once | INTRAVENOUS | Status: AC
  Administered 2017-09-11: 19 mL via INTRAVENOUS

## 2017-09-11 MED ORDER — VANCOMYCIN HCL 10 G IV SOLR
1500.0000 mg | Freq: Once | INTRAVENOUS | Status: AC
  Administered 2017-09-11: 1500 mg via INTRAVENOUS
  Filled 2017-09-11: qty 1500

## 2017-09-11 MED ORDER — DEXTROSE-NACL 5-0.45 % IV SOLN
INTRAVENOUS | Status: DC
  Administered 2017-09-11 – 2017-09-12 (×2): via INTRAVENOUS

## 2017-09-11 MED ORDER — DEXTROSE 5 % IV SOLN
2.0000 g | Freq: Once | INTRAVENOUS | Status: AC
  Administered 2017-09-11: 2 g via INTRAVENOUS
  Filled 2017-09-11: qty 2

## 2017-09-11 MED ORDER — PHENYLEPHRINE 40 MCG/ML (10ML) SYRINGE FOR IV PUSH (FOR BLOOD PRESSURE SUPPORT)
120.0000 ug | PREFILLED_SYRINGE | Freq: Once | INTRAVENOUS | Status: AC
  Administered 2017-09-11: 120 ug via INTRAVENOUS
  Filled 2017-09-11: qty 10

## 2017-09-11 MED ORDER — SODIUM CHLORIDE 0.9 % IV SOLN
INTRAVENOUS | Status: DC | PRN
  Administered 2017-09-11 (×3): 1000 mL via INTRAVENOUS

## 2017-09-11 MED ORDER — PROPOFOL 1000 MG/100ML IV EMUL
5.0000 ug/kg/min | INTRAVENOUS | Status: DC
Start: 2017-09-11 — End: 2017-09-14
  Filled 2017-09-11: qty 100

## 2017-09-11 MED ORDER — SODIUM CHLORIDE 0.9 % IV SOLN
1500.0000 mg | INTRAVENOUS | Status: AC
  Administered 2017-09-11: 1500 mg via INTRAVENOUS
  Filled 2017-09-11: qty 30

## 2017-09-11 MED ORDER — DEXTROSE 50 % IV SOLN
INTRAVENOUS | Status: AC
  Filled 2017-09-11: qty 50

## 2017-09-11 MED ORDER — ASPIRIN 81 MG PO CHEW: 324.0000 mg | CHEWABLE_TABLET | ORAL | Status: AC

## 2017-09-11 MED ORDER — SODIUM CHLORIDE 0.9 % IV SOLN: INTRAVENOUS | Status: DC | PRN

## 2017-09-11 MED ORDER — SODIUM CHLORIDE 0.9 % IV SOLN
500.0000 mg | Freq: Two times a day (BID) | INTRAVENOUS | Status: DC
  Administered 2017-09-11 – 2017-09-16 (×10): 500 mg via INTRAVENOUS
  Filled 2017-09-11 (×11): qty 5

## 2017-09-11 MED ORDER — SODIUM CHLORIDE 0.9 % IV SOLN
INTRAVENOUS | Status: DC
  Administered 2017-09-11: 16:00:00 via INTRAVENOUS

## 2017-09-11 MED ORDER — SODIUM CHLORIDE 0.9 % IV SOLN
INTRAVENOUS | Status: DC
  Administered 2017-09-11: 5.4 [IU]/h via INTRAVENOUS
  Filled 2017-09-11: qty 1

## 2017-09-11 MED ORDER — CHLORHEXIDINE GLUCONATE 0.12% ORAL RINSE (MEDLINE KIT)
15.0000 mL | Freq: Two times a day (BID) | OROMUCOSAL | Status: DC
  Administered 2017-09-11 – 2017-09-16 (×10): 15 mL via OROMUCOSAL

## 2017-09-11 MED ORDER — ASPIRIN 300 MG RE SUPP
300.0000 mg | RECTAL | Status: AC
  Administered 2017-09-11: 300 mg via RECTAL
  Filled 2017-09-11: qty 1

## 2017-09-11 MED ORDER — LORAZEPAM 2 MG/ML IJ SOLN
2.0000 mg | Freq: Once | INTRAMUSCULAR | Status: AC
  Administered 2017-09-11: 2 mg via INTRAVENOUS

## 2017-09-11 MED ORDER — ENOXAPARIN SODIUM 30 MG/0.3ML ~~LOC~~ SOLN
30.0000 mg | SUBCUTANEOUS | Status: DC
  Administered 2017-09-11: 30 mg via SUBCUTANEOUS
  Filled 2017-09-11: qty 0.3

## 2017-09-11 MED ORDER — DEXTROSE 5 % IV SOLN
1.0000 g | Freq: Three times a day (TID) | INTRAVENOUS | Status: DC
  Administered 2017-09-11 – 2017-09-16 (×14): 1 g via INTRAVENOUS
  Filled 2017-09-11 (×15): qty 1

## 2017-09-11 NOTE — ED Notes (Signed)
Pt taken to CT by this RN and RT 

## 2017-09-11 NOTE — ED Notes (Signed)
Dr Amada JupiterKirkpatrick in room to assess EEG. He ordered to go up with the Propofol to 80 and to give the fosphenytoin

## 2017-09-11 NOTE — Consult Note (Signed)
Neurology Consultation Reason for Consult: Partial status epilepticus Referring Physician: Rubin PayorPickering, N  CC: Partial status epilepticus  History is obtained from: Referring provider  HPI: Alejandro [name] is a 76 y.o. male with a history of hypertension, stroke who presents with status epilepticus.  Apparently he has not been feeling well for a couple of days, but was still able to speak on EMS arrival.  He began having convulsions on route and received 7.5 mg of IV Versed.  On arrival, he was in status epilepticus, given a total of 8 mg of IV Ativan without cessation.  Fosphenytoin and Keppra were ordered, but there is delay getting them from pharmacy and therefore propofol was started following bolus administration his activity ceased.  Keppra was loaded and stat EEG was obtained.  Shortly thereafter, he began having clonic activity of the jaw, EEG is obliterated by muscle artifact, but I suspect that he is having some ongoing partial seizure activity though there is other background activity on the EEG as well.  He was therefore given fosphenytoin, however this has caused his pressures to drop precipitously.  Of note, his blood glucoses in the 800s.  ROS:  Unable to obtain due to altered mental status.   Past Medical History:  Diagnosis Date  . Asthma   . Chronic progressive renal failure, stage 3 (moderate)   . Diabetic neuropathy (HCC)   . HLD (hyperlipidemia)   . Hypertension   . Left hemiparesis (HCC)   . Stroke [facility](HCC) 2000   left side deficits  . Uncontrolled diabetes mellitus (HCC)      Family History  Problem Relation Age of Onset  . Hypertension Unknown   . Stroke Mother   . Heart disease Father   . Heart disease Sister   . Diabetes Sister   . Heart disease Brother      Social History:  reports that he has quit smoking. He has never used smokeless tobacco. He reports that he does not drink alcohol or use drugs.   Exam: Current vital signs: SpO2 100%  Vital  signs in last 24 hours: SpO2:  [100 %] 100 % (10/27 1116)   Physical Exam  Constitutional: Appears elderly,  Psych: Does not respond Eyes: No scleral injection HENT: Intubated Head: Normocephalic.  Cardiovascular: Normal rate and regular rhythm.  Respiratory: Ventilated GI: Soft.  No distension.  GU: Diaper in place Skin: WDI  Neuro: Mental Status: Patient is obtunded, actively seizing Cranial Nerves: II: He does not blink to threat. Pupils are equal, round, and reactive to light.   III,IV, VI: He has a right gaze deviation V: VII: Corneals absent bilaterally IX: Gag intact Motor: He does not respond to noxious stimuli, has persistent clonic activity of the right side Sensory: Does not respond to noxious stimuli Cerebellar: Does not perform    I have reviewed labs in epic and the results pertinent to this consultation are: Normal sodium, mild hyperkalemia, glucose 846, creatinine 2.37  I have reviewed the images obtained: CT head- atrophy  Impression: 76 year old male with new onset status epilepticus, I suspect hyperosmolar state as an etiology.  With persistent jaw clonic activity, would favor continued treatment, he may need pressors to counteract the effect of the propofol.  Recommendations: 1) titration of sedation to cessation of seizure activity 2) continue Keppra 500 mg's twice daily (renally dosed) 3) will follow.    Ritta SlotMcNeill Deloise Marchant, MD [facility] [phone]  If 7pm- 7am, please page neurology on call as listed in AMION.

## 2017-09-11 NOTE — ED Notes (Signed)
40 of propofol bolus given from bag per Dr Rubin PayorPickering

## 2017-09-11 NOTE — ED Notes (Signed)
EEG in room 

## 2017-09-11 NOTE — ED Notes (Signed)
ICU in room.

## 2017-09-11 NOTE — Progress Notes (Signed)
Pharmacy Antibiotic Note [name] is a 76 y.o. male admitted on 2017-09-26 with status epilepticus thought to be 2/2 to non-ketotic hyperosmolar coma. Pharmacy has been consulted for Azactam and vancomycin dosing. Pt has reported anaphylactic allergy to PCN with no documentation of receiving cephalosporins per records.    Plan: 1. Vancomycin 1500 mg IV x 1 now followed by 750 mg every 24 hours starting on 10/28 2. Azactam 1 gram IV every 8 hours  Height: 5\' 10"  (177.8 cm) Weight: 176 lb 5.9 oz (80 kg) IBW/kg (Calculated) : 73  Temp (24hrs), Avg:98.4 F (36.9 C), Min:98.4 F (36.9 C), Max:98.4 F (36.9 C)   Recent Labs Lab 2017/02/15 1129 2017/02/15 1137 2017/02/15 1455  WBC 17.0*  --   --   CREATININE 2.37* 1.80*  --   LATICACIDVEN  --  8.98* 4.05*    Estimated Creatinine Clearance: 35.5 mL/min (A) (by C-G formula based on SCr of 1.8 mg/dL (H)).    Allergies  Allergen Reactions  . Coumadin [Warfarin Sodium] Palpitations    Heart races  . Penicillins Hives and Anaphylaxis    Antimicrobials this admission: 10/27 Azactam >>  10/27 vancomycin >>   Microbiology results: 10/27 BCx: px 10/27 UCx: px   Thank you for allowing pharmacy to be a part of this patient's care.  Pollyann SamplesAndy Dorrie Cocuzza, PharmD, BCPS 2017-09-26, 3:35 PM

## 2017-09-11 NOTE — H&P (Signed)
PULMONARY / CRITICAL CARE MEDICINE   Name: Alejandro [name] MRN: [id number] DOB: 11/18/1940    ADMISSION DATE:  08/31/2017 CONSULTATION DATE:  09/03/2017   REFERRING MD:  Dorris CarnesN. Pickering  CHIEF COMPLAINT:  Status epilepticus probably secondary to non-ketotic hyperosmolar coma  HISTORY OF PRESENT ILLNESS:   This is a 76 y.o B M admitted through the ED after being found unresponsive by his son at home. It is not known how long beforehand that he was observed in his usual state. He is known to have a hx HTN and Type II DM and is s/p CVA with residual left-sided hemiparesis. Subsequent to arrival of EMS at his home, he had a GM seizure - received ativan, and [facility]. He has since received Keppra and fosphenytoin to control his seizures, in addition to propofol. Head CT in the ED revealed no acute pathology and only atrophy and small vessel disease. Initial glucose >700. Neurology has also been consulted.  PAST MEDICAL HISTORY :  He  has a past medical history of Asthma; Chronic progressive renal failure, stage 3 (moderate) (HCC); Diabetic neuropathy (HCC); HLD (hyperlipidemia); Hypertension; Left hemiparesis (HCC); Seizures (HCC); Stroke [place] [facility](HCC) (2000); and Uncontrolled diabetes mellitus (HCC).  PAST SURGICAL HISTORY: He  has a past surgical history that includes Eye surgery; Cardiac catheterization; and Cardiovascular stress test.  Allergies  Allergen Reactions  . Coumadin [Warfarin Sodium] Palpitations    Heart races  . Penicillins Hives and Anaphylaxis    No current facility-administered medications on file prior to encounter.    Current Outpatient Prescriptions on File Prior to Encounter  Medication Sig  . amLODipine (NORVASC) 10 MG tablet Take 10 mg by mouth daily.  Marland Kitchen. atorvastatin (LIPITOR) 40 MG tablet Take 1 tablet (40 mg total) by mouth daily.  . cholecalciferol (VITAMIN D) 1000 UNITS tablet Take 2,000 Units by  mouth daily.   . clopidogrel (PLAVIX) 75 MG tablet Take 1 tablet (75 mg total) by mouth daily. (Patient not taking: Reported on 09/21/2015)  . furosemide (LASIX) 20 MG tablet Take 20 mg by mouth daily.  . hydrochlorothiazide (MICROZIDE) 12.5 MG capsule Take 12.5 mg by mouth daily.  . insulin glargine (LANTUS) 100 UNIT/ML injection Inject 0.4 mLs (40 Units total) into the skin 2 (two) times daily.  . Insulin Glargine (TOUJEO SOLOSTAR) 300 UNIT/ML SOPN Inject 70 Units into the skin at bedtime.  Marland Kitchen. losartan (COZAAR) 100 MG tablet Take 100 mg by mouth daily.  . Multiple Minerals (CALCIUM-MAGNESIUM-ZINC) TABS Take 1 tablet by mouth daily.  . Multiple Vitamins-Minerals (MULTIVITAMIN WITH MINERALS) tablet Take 1 tablet by mouth daily.  . potassium chloride (K-DUR,KLOR-CON) 10 MEQ tablet Take 2 tablets (20 mEq total) by mouth daily. (Patient taking differently: Take 10 mEq by mouth daily. )    FAMILY HISTORY:  His indicated that his mother is deceased. He indicated that his father is deceased. He indicated that both of his sisters are alive. He indicated that both of his brothers are alive. He indicated that the status of his unknown relative is unknown.    SOCIAL HISTORY: He  reports that he has quit smoking. He has never used smokeless tobacco. He reports that he does not drink alcohol or use drugs.  REVIEW OF SYSTEMS:   The patient is unresponsive. However, ROS obtained during an office visit Nov., 2016 were:  Constitutional: negative for fever, chills, night sweats, weight changes, or fatigue  HEENT: negative for vision changes,  hearing loss, congestion, rhinorrhea, ST, epistaxis, or sinus pressure Cardiovascular: negative for chest pain or palpitations Respiratory: negative for hemoptysis, wheezing, shortness of breath, or cough Abdominal: negative for abdominal pain, nausea, vomiting, diarrhea, or constipation Dermatological: negative for rash Neurologic: negative for headache, dizziness, or  syncope GU: positive for urinary incontinence All other systems reviewed and are otherwise negative   SUBJECTIVE:  Unresponsive  VITAL SIGNS: BP 115/83   Pulse 85   Temp 98.4 F (36.9 C) (Rectal)   Resp (!) 24   Ht 5\' 10"  (1.778 m)   Wt 80 kg (176 lb 5.9 oz)   SpO2 100%   BMI 25.31 kg/m   HEMODYNAMICS: Stable off pressors    VENTILATOR SETTINGS: Vent Mode: PRVC FiO2 (%):  [100 %] 100 % Set Rate:  [15 bmp] 15 bmp Vt Set:  [500 mL-580 mL] 580 mL PEEP:  [5 cmH20] 5 cmH20  INTAKE / OUTPUT: No intake/output data recorded.  PHYSICAL EXAMINATION: General:  WD/WN B M sedated with EEG running Neuro:  Pupils sluggishly reactive. Unresponsive to verbal and noxious stimuli HEENT:  [place]/AT; fundi difficult to visualize but no hemm/exudates discernible; TM clear AD, cerumen AS; ETT in situ Cardiovascular:  Regular rhythm without murmurs. No carotid, abdominal or femoral bruits Lungs:  Clear to auscultation Abdomen:  Supple Musculoskeletal:  No obvious joint deformities Skin:  No rashes. Trace edema  LABS:  BMET  Recent Labs Lab 10-04-17 1129 10-04-2017 1137  NA 136 136  K 5.4* 5.4*  CL 98* 104  CO2 19*  --   BUN 31* 44*  CREATININE 2.37* 1.80*  GLUCOSE 846* >700*    Electrolytes  Recent Labs Lab 10-04-17 1129  CALCIUM 8.9    CBC  Recent Labs Lab 10/04/17 1129 10-04-17 1137  WBC 17.0*  --   HGB 13.7 15.6  HCT 42.7 46.0  PLT 198  --     Coag's No results for input(s): APTT, INR in the last 168 hours.  Sepsis Markers  Recent Labs Lab 2017-10-04 1137  LATICACIDVEN 8.98*    ABG No results for input(s): PHART, PCO2ART, PO2ART in the last 168 hours.  Liver Enzymes  Recent Labs Lab 10/04/2017 1129  AST 31  ALT 16*  ALKPHOS 87  BILITOT 0.8  ALBUMIN 2.8*    Cardiac Enzymes No results for input(s): TROPONINI, PROBNP in the last 168 hours.  Glucose  Recent Labs Lab October 04, 2017 1137 2017/10/04 1349  GLUCAP >600* >600*    Imaging Ct Head Wo  Contrast  Result Date: 04-Oct-2017 CLINICAL DATA:  Pt found unresponsive on the floor this morning by his son. No obvious trauma. Hx: Seizures, Diabetes, Stroke EXAM: CT HEAD WITHOUT CONTRAST TECHNIQUE: Contiguous axial images were obtained from the base of the skull through the vertex without intravenous contrast. COMPARISON:  02/28/2015 CT and MR FINDINGS: Brain: There is significant central cortical atrophy. Periventricular white matter changes are compatible with small vessel disease. There is no intra or extra-axial fluid collection or mass lesion. The basilar cisterns and ventricles have a normal appearance. There is no CT evidence for acute infarction or hemorrhage. Vascular: No hyperdense vessel or unexpected calcification. Skull: Normal. Negative for fracture or focal lesion. Sinuses/Orbits: There is extensive opacity throughout the ethmoid, sphenoid, and maxillary sinuses. Endotracheal tube is in place. Mastoid air cells are normally aerated. Other: None IMPRESSION: 1. Atrophy and significant small vessel disease. 2.  No evidence for acute  abnormality. Electronically Signed   By: Norva Pavlov M.D.   On:  09/02/2017 12:41   Dg Chest Portable 1 View  Result Date: 08/22/2017 CLINICAL DATA:  Encounter for ETT placement. AMS. Hx of stroke, HTN, DM, asthma. EXAM: PORTABLE CHEST - 1 VIEW COMPARISON:  02/28/2015 FINDINGS: Endotracheal tube tip 5 cm above carina. Lungs are clear. No pneumothorax. Heart size normal.  Atheromatous aorta. No effusion. Visualized bones unremarkable. IMPRESSION: 1. Good position of endotracheal tube.  No acute findings. Electronically Signed   By: Corlis Leak M.D.   On: 08/16/2017 12:17     STUDIES:  Head CT as above. 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 Will need to r/o infection as a  pecipitant.  ASSESSMENT / PLAN:  PULMONARY A: CXR without acute process. O2 sat 100% on FIO2 1.0 P:   Will recheck ABG  CARDIOVASCULAR A:  Hemodynamically stable P:  Continue rehydration  RENAL A:   Indicies c/w pre-renal azotemia, although hx of mild renal insufficiency P:   Continue hydrastion  GASTROINTESTINAL A:   As intubated will need GI prophylaxis P:   Protonix   HEMATOLOGIC A:   H&H OK but patient is likely dehydrated P:  Recheck after rehydration  INFECTIOUS A:   Leukocytosis. U/A suggestive of UTI P:   Urine culture pending. Will obtain BC. Empiric Abs but should d/c if cultures negative  ENDOCRINE A:   Hyperosmolar non-ketotic coma with secondary seizures   P:   Continue hydration; insulin may not be needed  NEUROLOGIC A:   Status epilepticus P:   Meds per Neurology   FAMILY  No family present   Pulmonary and Critical Care Medicine [facility] Pager: [phone]  09/14/2017, 2:35 PM

## 2017-09-11 NOTE — Progress Notes (Signed)
EEG complete - results pending 

## 2017-09-11 NOTE — ED Provider Notes (Signed)
[facility] Provider Note   CSN: [id number] Arrival date & time: 03-02-17  1115     History   Chief Complaint Chief Complaint  Patient presents with  . Seizures   Level 5 caveat due to intubation and unresponsiveness HPI Alejandro [name] is a 76 y.o. male.  HPI Patient presents after EMS called out for unresponsiveness.  Reportedly was somewhat verbal but very confused initially then began to seize for fire.  Intubated by EMS.  Had a sugar that was off the meter.  Reportedly has not been taking his medicines.  Reportedly has had some confusion for the last month.  EMS gave 7.5 mg of Versed and still has some seizure activity upon arrival. Past Medical History:  Diagnosis Date  . Asthma   . Chronic progressive renal failure, stage 3 (moderate) (HCC)   . Diabetic neuropathy (HCC)   . HLD (hyperlipidemia)   . Hypertension   . Left hemiparesis (HCC)   . Seizures (HCC)   . Stroke [facility](HCC) 2000   left side deficits  . Uncontrolled diabetes mellitus [facility](HCC)     Patient Active Problem List   Diagnosis Date Noted  . Status epilepticus, generalized convulsive (HCC) Aug 30, 2017  . Weakness   . Acute encephalopathy 03/01/2015  . HLD (hyperlipidemia) 02/28/2015  . Stroke (HCC) 02/28/2015  . Hypokalemia 11/30/2013  . Syncope and collapse 11/30/2013  . Uncontrolled diabetes mellitus (HCC) 11/30/2013  . Polyuria 11/30/2013  . Chronic progressive renal failure, stage 3 (moderate) (HCC)   . Diabetic neuropathy (HCC)   . Hypertension   . Left hemiparesis [facility](HCC)     Past Surgical History:  Procedure Laterality Date  . CARDIAC CATHETERIZATION    . CARDIOVASCULAR STRESS TEST    . EYE SURGERY         Home Medications    Prior to Admission medications   Medication Sig Start Date End Date Taking? Authorizing Provider  amLODipine (NORVASC) 10 MG tablet Take 10 mg by mouth daily.    [provider]  atorvastatin (LIPITOR) 40 MG  tablet Take 1 tablet (40 mg total) by mouth daily. 03/03/15   Jerald Kiefhiu, Stephen K, MD  cholecalciferol (VITAMIN D) 1000 UNITS tablet Take 2,000 Units by mouth daily.     [provider]  clopidogrel (PLAVIX) 75 MG tablet Take 1 tablet (75 mg total) by mouth daily. Patient not taking: Reported on 09/21/2015 03/03/15   Jerald Kiefhiu, Stephen K, MD  furosemide (LASIX) 20 MG tablet Take 20 mg by mouth daily. 09/27/14   [provider]  hydrochlorothiazide (MICROZIDE) 12.5 MG capsule Take 12.5 mg by mouth daily.    [provider]  insulin glargine (LANTUS) 100 UNIT/ML injection Inject 0.4 mLs (40 Units total) into the skin 2 (two) times daily. 09/21/15   Elvina SidleLauenstein, Kurt, MD  Insulin Glargine (TOUJEO SOLOSTAR) 300 UNIT/ML SOPN Inject 70 Units into the skin at bedtime. 09/21/15   Elvina SidleLauenstein, Kurt, MD  losartan (COZAAR) 100 MG tablet Take 100 mg by mouth daily.    [provider]  Multiple Minerals (CALCIUM-MAGNESIUM-ZINC) TABS Take 1 tablet by mouth daily.    [provider]  Multiple Vitamins-Minerals (MULTIVITAMIN WITH MINERALS) tablet Take 1 tablet by mouth daily.    [provider]  potassium chloride (K-DUR,KLOR-CON) 10 MEQ tablet Take 2 tablets (20 mEq total) by mouth daily. Patient taking differently: Take 10 mEq by mouth daily.  12/02/13   Leatha GildingGherghe, Costin M, MD    Family History Family History  Problem Relation Age of Onset  . Stroke Mother   . Heart disease Father   . Heart disease Sister   . Diabetes Sister   . Heart disease Brother   . Hypertension Unknown     Social History Social History  Substance Use Topics  . Smoking status: Former Games developer  . Smokeless tobacco: Never Used  . Alcohol use No     Allergies   Coumadin [warfarin sodium] and Penicillins   Review of Systems Review of Systems  Unable to perform ROS: Intubated     Physical Exam Updated Vital Signs BP 104/80   Pulse 86   Temp 98.4 F (36.9 C) (Rectal)   Resp 19    Ht 5\' 10"  (1.778 m)   Wt 80 kg (176 lb 5.9 oz)   SpO2 100%   BMI 25.31 kg/m   Physical Exam  Constitutional: He appears well-developed.  HENT:  Head: Atraumatic.  Eyes:  Eyes deviated to right  Neck: Normal range of motion.  Patient is intubated  Cardiovascular:  Tachycardia  Pulmonary/Chest:  Equal breath sounds bilaterally  Abdominal: He exhibits no distension.  Musculoskeletal: He exhibits no edema.  Neurological:  Patient is having clonic activity on the right side.  No real response on the left side.  No response to pain.  Skin: Skin is warm.     ED Treatments / Results  Labs (all labs ordered are listed, but only abnormal results are displayed) Labs Reviewed  COMPREHENSIVE METABOLIC PANEL - Abnormal; Notable for the following:       Result Value   Potassium 5.4 (*)    Chloride 98 (*)    CO2 19 (*)    Glucose, Bld 846 (*)    BUN 31 (*)    Creatinine, Ser 2.37 (*)    Total Protein 8.7 (*)    Albumin 2.8 (*)    ALT 16 (*)    GFR calc non Af Amer 25 (*)    GFR calc Af Amer 29 (*)    Anion gap 19 (*)    All other components within normal limits  CBC WITH DIFFERENTIAL/PLATELET - Abnormal; Notable for the following:    WBC 17.0 (*)    MCH 25.8 (*)    Neutro Abs 15.5 (*)    All other components within normal limits  URINALYSIS, ROUTINE W REFLEX MICROSCOPIC - Abnormal; Notable for the following:    Color, Urine STRAW (*)    APPearance HAZY (*)    Glucose, UA >=500 (*)    Hgb urine dipstick MODERATE (*)    Ketones, ur 5 (*)    Protein, ur 30 (*)    Leukocytes, UA MODERATE (*)    Bacteria, UA RARE (*)    All other components within normal limits  AMMONIA - Abnormal; Notable for the following:    Ammonia 62 (*)    All other components within normal limits  I-STAT CHEM 8, ED - Abnormal; Notable for the following:    Potassium 5.4 (*)    BUN 44 (*)    Creatinine, Ser 1.80 (*)    Glucose, Bld >700 (*)    Calcium, Ion 1.05 (*)    TCO2 19 (*)    All other  components within normal limits  I-STAT CG4 LACTIC ACID, ED - Abnormal; Notable for the following:    Lactic Acid, Venous 8.98 (*)    All other components within normal limits  CBG MONITORING, ED - Abnormal; Notable for the following:  Glucose-Capillary >600 (*)    All other components within normal limits  I-STAT VENOUS BLOOD GAS, ED - Abnormal; Notable for the following:    pH, Ven 7.084 (*)    pCO2, Ven 73.9 (*)    pO2, Ven 29.0 (*)    Acid-base deficit 9.0 (*)    All other components within normal limits  I-STAT CG4 LACTIC ACID, ED - Abnormal; Notable for the following:    Lactic Acid, Venous 4.05 (*)    All other components within normal limits  CBG MONITORING, ED - Abnormal; Notable for the following:    Glucose-Capillary >600 (*)    All other components within normal limits  CBG MONITORING, ED - Abnormal; Notable for the following:    Glucose-Capillary 511 (*)    All other components within normal limits  I-STAT ARTERIAL BLOOD GAS, ED - Abnormal; Notable for the following:    pH, Arterial 7.342 (*)    pO2, Arterial 204.0 (*)    Bicarbonate 18.6 (*)    TCO2 20 (*)    Acid-base deficit 6.0 (*)    All other components within normal limits  URINE CULTURE  CULTURE, BLOOD (ROUTINE X 2)  CULTURE, BLOOD (ROUTINE X 2)  ETHANOL  CK  BLOOD GAS, VENOUS  BASIC METABOLIC PANEL  BLOOD GAS, ARTERIAL    EKG  EKG Interpretation None       Radiology Ct Head Wo Contrast  Result Date: 09/05/2017 CLINICAL DATA:  Pt found unresponsive on the floor this morning by his son. No obvious trauma. Hx: Seizures, Diabetes, Stroke EXAM: CT HEAD WITHOUT CONTRAST TECHNIQUE: Contiguous axial images were obtained from the base of the skull through the vertex without intravenous contrast. COMPARISON:  02/28/2015 CT and MR FINDINGS: Brain: There is significant central cortical atrophy. Periventricular white matter changes are compatible with small vessel disease. There is no intra or extra-axial  fluid collection or mass lesion. The basilar cisterns and ventricles have a normal appearance. There is no CT evidence for acute infarction or hemorrhage. Vascular: No hyperdense vessel or unexpected calcification. Skull: Normal. Negative for fracture or focal lesion. Sinuses/Orbits: There is extensive opacity throughout the ethmoid, sphenoid, and maxillary sinuses. Endotracheal tube is in place. Mastoid air cells are normally aerated. Other: None IMPRESSION: 1. Atrophy and significant small vessel disease. 2.  No evidence for acute  abnormality. Electronically Signed   By: Norva Pavlov M.D.   On: 09/01/2017 12:41   Dg Chest Portable 1 View  Result Date: 08/27/2017 CLINICAL DATA:  Encounter for ETT placement. AMS. Hx of stroke, HTN, DM, asthma. EXAM: PORTABLE CHEST - 1 VIEW COMPARISON:  02/28/2015 FINDINGS: Endotracheal tube tip 5 cm above carina. Lungs are clear. No pneumothorax. Heart size normal.  Atheromatous aorta. No effusion. Visualized bones unremarkable. IMPRESSION: 1. Good position of endotracheal tube.  No acute findings. Electronically Signed   By: Corlis Leak M.D.   On: 08/21/2017 12:17   Dg Abd Portable 1 View  Result Date: 08/31/2017 CLINICAL DATA:  Orogastric tube placement EXAM: PORTABLE ABDOMEN - 1 VIEW COMPARISON:  None. FINDINGS: Stool seen throughout the colon sparing the sigmoid and descending segments. An orogastric tube tip is at the level the distal stomach. No small bowel obstruction. No concerning mass effect or calcification. IMPRESSION: 1. Orogastric tube tip at the distal stomach. 2. Moderate stool volume. Electronically Signed   By: Marnee Spring M.D.   On: 08/28/2017 15:11    Procedures Procedures (including critical care time)  Medications Ordered in ED  Medications  propofol (DIPRIVAN) 1000 MG/100ML infusion (not administered)  propofol (DIPRIVAN) 1000 MG/100ML infusion (not administered)  0.9 %  sodium chloride infusion ( Intravenous Stopped 09/07/2017 1358)    dextrose 5 %-0.45 % sodium chloride infusion (not administered)  insulin regular (NOVOLIN R,HUMULIN R) 100 Units in sodium chloride 0.9 % 100 mL (1 Units/mL) infusion (4.5 Units/hr Intravenous Rate/Dose Change 08/17/2017 1539)  0.9 %  sodium chloride infusion (not administered)  aspirin chewable tablet 324 mg (not administered)    Or  aspirin suppository 300 mg (not administered)  enoxaparin (LOVENOX) injection 30 mg (not administered)  pantoprazole sodium (PROTONIX) 40 mg/20 mL oral suspension 40 mg (not administered)  0.9 %  sodium chloride infusion (not administered)  vancomycin (VANCOCIN) 1,500 mg in sodium chloride 0.9 % 500 mL IVPB (not administered)  aztreonam (AZACTAM) 2 g in dextrose 5 % 50 mL IVPB (not administered)  aztreonam (AZACTAM) 1 g in dextrose 5 % 50 mL IVPB (not administered)  vancomycin (VANCOCIN) IVPB 750 mg/150 ml premix (not administered)  LORazepam (ATIVAN) 2 MG/ML injection (2 mg  Given 09/10/2017 1123)  fosPHENYtoin (CEREBYX) 1,500 mg PE in sodium chloride 0.9 % 50 mL IVPB (0 mg PE Intravenous Stopped 08/25/2017 1330)  levETIRAcetam (KEPPRA) 1,500 mg in sodium chloride 0.9 % 100 mL IVPB (0 mg Intravenous Stopped 09/04/2017 1209)  LORazepam (ATIVAN) 2 MG/ML injection (2 mg  Given 09/10/2017 1125)  LORazepam (ATIVAN) 2 MG/ML injection (2 mg  Given 08/30/2017 1129)  LORazepam (ATIVAN) injection 2 mg (2 mg Intravenous Given 09/02/2017 1131)  propofol (DIPRIVAN) 1000 MG/100ML infusion (40 mcg/kg/min  80 kg Intravenous Rate/Dose Change 08/27/2017 1540)  PHENYLephrine 40 mcg/ml in normal saline Adult IV Push Syringe (120 mcg Intravenous Given 08/31/2017 1340)     Initial Impression / Assessment and Plan / ED Course  I have reviewed the triage vital signs and the nursing notes.  Pertinent labs & imaging results that were available during my care of the patient were reviewed by me and considered in my medical decision making (see chart for details).     Patient brought in in status  epilepticus.   had 7.5 mg of Versed by EMS.  Required 8 mg of Ativan and 80 mg of propofol to stop the seizure.  Still had rare tremor on the right side.  Dr. Amada Jupiter was helping break the seizure.  Later loaded on Keppra and fosphenytoin.  The fosphenytoin and the propofol drip he was on however did cause hypotension required a push dose phenylephrine to help bring his blood pressure back up.  Patient appears to be in DKA with some mild acidosis worsening renal function.  Chest x-ray showed that the intubation was in place.  Bedside EEG was done.  Hyperglycemia.  Discussed with critical care and they admitted the patient patient around 2 hours later.  CRITICAL CARE Performed by: Billee Cashing Total critical care time: 60 minutes Critical care time was exclusive of separately billable procedures and treating other patients. Critical care was necessary to treat or prevent imminent or life-threatening deterioration. Critical care was time spent personally by me on the following activities: development of treatment plan with patient and/or surrogate as well as nursing, discussions with consultants, evaluation of patient's response to treatment, examination of patient, obtaining history from patient or surrogate, ordering and performing treatments and interventions, ordering and review of laboratory studies, ordering and review of radiographic studies, pulse oximetry and re-evaluation of patient's condition.  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

## 2017-09-11 NOTE — ED Notes (Signed)
Pt back from CT with this RN and  RT 

## 2017-09-11 NOTE — Procedures (Signed)
Arterial Catheter Insertion Procedure Note Alejandro [name] [id number] 1941/08/07  Procedure: Insertion of Arterial Catheter  Indications: Frequent blood sampling  Procedure Details Consent: Unable to obtain consent because of altered level of consciousness. Time Out: Verified patient identification, verified procedure, site/side was marked, verified correct patient position, special equipment/implants available, medications/allergies/relevent history reviewed, required imaging and test results available.  Performed  Maximum sterile technique was used including antiseptics, cap, gloves, gown, hand hygiene, mask and sheet. Skin prep: Chlorhexidine; local anesthetic administered 20 gauge catheter was inserted into right radial artery using the Seldinger technique.  Evaluation Blood flow good; BP tracing good. Complications: No apparent complications.   Alejandro [name], Alejandro [name] Sep 15, 2017

## 2017-09-11 NOTE — ED Notes (Signed)
Seizing stopped.

## 2017-09-11 NOTE — ED Triage Notes (Signed)
Per EMS, pt found unresponsive by his son this morning. Unknown LSN. Pt initially breathing on his own, CBG read high, fire stated that pt had seizure activity while on scene. Pt had 2 episodes in route. Pt has 8.0 ETT in place, 18ga in right forearm. Pt given 7.5 midazolam in route. Pupils gazed to the right. Pt received NS. GCS 3 the entire time.

## 2017-09-11 NOTE — ED Notes (Signed)
Pt's BP now over 100 systolic, Dr Rubin PayorPickering ordered to restart propofol at 50

## 2017-09-11 NOTE — ED Notes (Signed)
POrtable in room 

## 2017-09-11 NOTE — ED Notes (Signed)
Pt's BP 75/62, Dr Rubin PayorPickering notified and in the room. Propofol stopped and 1L bolus started. Pt has pulses and NSR on monitor.

## 2017-09-11 NOTE — Progress Notes (Signed)
Pt transported to CT1 and back to TR C on vent without complications.

## 2017-09-12 ENCOUNTER — Inpatient Hospital Stay (HOSPITAL_COMMUNITY): Payer: Medicare Other

## 2017-09-12 LAB — URINE CULTURE: CULTURE: NO GROWTH

## 2017-09-12 LAB — BLOOD GAS, ARTERIAL
ACID-BASE DEFICIT: 3.2 mmol/L — AB (ref 0.0–2.0)
Bicarbonate: 20.7 mmol/L (ref 20.0–28.0)
DRAWN BY: 308601
FIO2: 40
O2 Saturation: 99 %
PCO2 ART: 33.1 mmHg (ref 32.0–48.0)
PEEP: 5 cmH2O
PH ART: 7.412 (ref 7.350–7.450)
Patient temperature: 98.6
RATE: 15 resp/min
VT: 580 mL
pO2, Arterial: 142 mmHg — ABNORMAL HIGH (ref 83.0–108.0)

## 2017-09-12 LAB — GLUCOSE, CAPILLARY
GLUCOSE-CAPILLARY: 274 mg/dL — AB (ref 65–99)
GLUCOSE-CAPILLARY: 93 mg/dL (ref 65–99)
Glucose-Capillary: 103 mg/dL — ABNORMAL HIGH (ref 65–99)
Glucose-Capillary: 111 mg/dL — ABNORMAL HIGH (ref 65–99)
Glucose-Capillary: 150 mg/dL — ABNORMAL HIGH (ref 65–99)
Glucose-Capillary: 157 mg/dL — ABNORMAL HIGH (ref 65–99)
Glucose-Capillary: 177 mg/dL — ABNORMAL HIGH (ref 65–99)
Glucose-Capillary: 185 mg/dL — ABNORMAL HIGH (ref 65–99)
Glucose-Capillary: 213 mg/dL — ABNORMAL HIGH (ref 65–99)
Glucose-Capillary: 228 mg/dL — ABNORMAL HIGH (ref 65–99)
Glucose-Capillary: 41 mg/dL — CL (ref 65–99)

## 2017-09-12 LAB — RENAL FUNCTION PANEL
ALBUMIN: 2.1 g/dL — AB (ref 3.5–5.0)
ANION GAP: 6 (ref 5–15)
BUN: 20 mg/dL (ref 6–20)
CALCIUM: 8.1 mg/dL — AB (ref 8.9–10.3)
CO2: 22 mmol/L (ref 22–32)
CREATININE: 1.29 mg/dL — AB (ref 0.61–1.24)
Chloride: 114 mmol/L — ABNORMAL HIGH (ref 101–111)
GFR calc non Af Amer: 52 mL/min — ABNORMAL LOW (ref >60)
GLUCOSE: 184 mg/dL — AB (ref 65–99)
PHOSPHORUS: 2.8 mg/dL (ref 2.5–4.6)
Potassium: 3.5 mmol/L (ref 3.5–5.1)
SODIUM: 142 mmol/L (ref 135–145)

## 2017-09-12 LAB — PROCALCITONIN: Procalcitonin: 0.84 ng/mL

## 2017-09-12 LAB — BASIC METABOLIC PANEL
Anion gap: 8 (ref 5–15)
BUN: 25 mg/dL — AB (ref 6–20)
CO2: 20 mmol/L — AB (ref 22–32)
Calcium: 7.8 mg/dL — ABNORMAL LOW (ref 8.9–10.3)
Chloride: 113 mmol/L — ABNORMAL HIGH (ref 101–111)
Creatinine, Ser: 1.57 mg/dL — ABNORMAL HIGH (ref 0.61–1.24)
GFR calc Af Amer: 47 mL/min — ABNORMAL LOW (ref >60)
GFR, EST NON AFRICAN AMERICAN: 41 mL/min — AB (ref >60)
GLUCOSE: 227 mg/dL — AB (ref 65–99)
POTASSIUM: 2.9 mmol/L — AB (ref 3.5–5.1)
Sodium: 141 mmol/L (ref 135–145)

## 2017-09-12 LAB — PHOSPHORUS: PHOSPHORUS: 2.3 mg/dL — AB (ref 2.5–4.6)

## 2017-09-12 LAB — TROPONIN I
TROPONIN I: 0.25 ng/mL — AB (ref <0.03)
Troponin I: 0.3 ng/mL (ref <0.03)

## 2017-09-12 LAB — CK: CK TOTAL: 454 U/L — AB (ref 49–397)

## 2017-09-12 LAB — CBC
HEMATOCRIT: 34.2 % — AB (ref 39.0–52.0)
Hemoglobin: 11.2 g/dL — ABNORMAL LOW (ref 13.0–17.0)
MCH: 25.3 pg — AB (ref 26.0–34.0)
MCHC: 32.7 g/dL (ref 30.0–36.0)
MCV: 77.2 fL — AB (ref 78.0–100.0)
PLATELETS: 153 10*3/uL (ref 150–400)
RBC: 4.43 MIL/uL (ref 4.22–5.81)
RDW: 14.8 % (ref 11.5–15.5)
WBC: 17.1 10*3/uL — ABNORMAL HIGH (ref 4.0–10.5)

## 2017-09-12 LAB — MAGNESIUM
MAGNESIUM: 1.6 mg/dL — AB (ref 1.7–2.4)
Magnesium: 1.9 mg/dL (ref 1.7–2.4)

## 2017-09-12 LAB — LACTIC ACID, PLASMA: Lactic Acid, Venous: 1.6 mmol/L (ref 0.5–1.9)

## 2017-09-12 MED ORDER — CLOPIDOGREL BISULFATE 75 MG PO TABS
75.0000 mg | ORAL_TABLET | Freq: Every day | ORAL | Status: DC
  Administered 2017-09-12 – 2017-09-16 (×5): 75 mg
  Filled 2017-09-12 (×5): qty 1

## 2017-09-12 MED ORDER — INSULIN ASPART 100 UNIT/ML ~~LOC~~ SOLN
0.0000 [IU] | SUBCUTANEOUS | Status: DC
  Administered 2017-09-12: 8 [IU] via SUBCUTANEOUS

## 2017-09-12 MED ORDER — SODIUM PHOSPHATES 45 MMOLE/15ML IV SOLN
10.0000 mmol | Freq: Once | INTRAVENOUS | Status: AC
  Administered 2017-09-12: 10 mmol via INTRAVENOUS
  Filled 2017-09-12: qty 3.33

## 2017-09-12 MED ORDER — POTASSIUM CHLORIDE 10 MEQ/50ML IV SOLN: 10.0000 meq | INTRAVENOUS | Status: DC

## 2017-09-12 MED ORDER — DEXTROSE 50 % IV SOLN
INTRAVENOUS | Status: AC
  Administered 2017-09-12: 50 mL
  Filled 2017-09-12: qty 50

## 2017-09-12 MED ORDER — FENTANYL CITRATE (PF) 100 MCG/2ML IJ SOLN
25.0000 ug | INTRAMUSCULAR | Status: DC | PRN
  Administered 2017-09-13 – 2017-09-14 (×3): 50 ug via INTRAVENOUS
  Filled 2017-09-12 (×3): qty 2

## 2017-09-12 MED ORDER — INSULIN ASPART 100 UNIT/ML ~~LOC~~ SOLN
0.0000 [IU] | SUBCUTANEOUS | Status: DC
  Administered 2017-09-12: 1 [IU] via SUBCUTANEOUS
  Administered 2017-09-12: 2 [IU] via SUBCUTANEOUS

## 2017-09-12 MED ORDER — POTASSIUM CHLORIDE 10 MEQ/100ML IV SOLN
10.0000 meq | INTRAVENOUS | Status: AC
  Administered 2017-09-12 (×6): 10 meq via INTRAVENOUS
  Filled 2017-09-12 (×7): qty 100

## 2017-09-12 MED ORDER — LACTATED RINGERS IV SOLN: INTRAVENOUS | Status: DC

## 2017-09-12 MED ORDER — PHENYTOIN SODIUM 50 MG/ML IJ SOLN
100.0000 mg | Freq: Three times a day (TID) | INTRAMUSCULAR | Status: DC
  Administered 2017-09-12 – 2017-09-13 (×5): 100 mg via INTRAVENOUS
  Filled 2017-09-12 (×5): qty 2

## 2017-09-12 MED ORDER — DEXTROSE IN LACTATED RINGERS 5 % IV SOLN
INTRAVENOUS | Status: DC
Start: 2017-09-12 — End: 2017-09-14
  Administered 2017-09-12 – 2017-09-14 (×2): via INTRAVENOUS

## 2017-09-12 MED ORDER — LORAZEPAM 2 MG/ML IJ SOLN: 1.0000 mg | INTRAMUSCULAR | Status: DC | PRN

## 2017-09-12 MED ORDER — LACTATED RINGERS IV BOLUS (SEPSIS)
1000.0000 mL | Freq: Once | INTRAVENOUS | Status: AC
  Administered 2017-09-12: 1000 mL via INTRAVENOUS

## 2017-09-12 MED ORDER — MAGNESIUM SULFATE 2 GM/50ML IV SOLN
2.0000 g | Freq: Once | INTRAVENOUS | Status: AC
  Administered 2017-09-12: 2 g via INTRAVENOUS
  Filled 2017-09-12: qty 50

## 2017-09-12 MED ORDER — SODIUM POLYSTYRENE SULFONATE 15 GM/60ML PO SUSP: 30.0000 g | Freq: Once | ORAL | Status: DC

## 2017-09-12 MED ORDER — INSULIN ASPART 100 UNIT/ML ~~LOC~~ SOLN
0.0000 [IU] | SUBCUTANEOUS | Status: DC
  Administered 2017-09-12 (×2): 2 [IU] via SUBCUTANEOUS
  Administered 2017-09-13: 7 [IU] via SUBCUTANEOUS
  Administered 2017-09-13: 5 [IU] via SUBCUTANEOUS
  Administered 2017-09-13: 2 [IU] via SUBCUTANEOUS
  Administered 2017-09-13: 3 [IU] via SUBCUTANEOUS
  Administered 2017-09-13: 5 [IU] via SUBCUTANEOUS

## 2017-09-12 MED ORDER — ATORVASTATIN CALCIUM 40 MG PO TABS
40.0000 mg | ORAL_TABLET | Freq: Every day | ORAL | Status: DC
  Administered 2017-09-12 – 2017-09-15 (×3): 40 mg
  Filled 2017-09-12 (×3): qty 1

## 2017-09-12 MED ORDER — HEPARIN SODIUM (PORCINE) 5000 UNIT/ML IJ SOLN
5000.0000 [IU] | Freq: Three times a day (TID) | INTRAMUSCULAR | Status: DC
  Administered 2017-09-12 – 2017-09-16 (×12): 5000 [IU] via SUBCUTANEOUS
  Filled 2017-09-12 (×12): qty 1

## 2017-09-12 NOTE — Progress Notes (Signed)
eLink Physician-Brief Progress Note Patient Name: Alejandro FuseChristopher Bajaj DOB: October 31, 1941 MRN: [id number]   Date of Service  09/12/2017  HPI/Events of Note  Blood glucose = 213.   eICU Interventions  Will advance sliding scale to Q 4 hour moderate Novolog SSI.     Intervention Category Major Interventions: Hyperglycemia - active titration of insulin therapy  Lareen Mullings Dennard Nipugene 09/12/2017, 6:29 AM

## 2017-09-12 NOTE — Progress Notes (Signed)
Subjective: Continues to be unresponsive.  I had a long talk with his daughter.  He has been declining over the past several years.  At baseline, he does make some food for himself, but has been leaving stuff on the burn for about a year.  More recently, he has had to be stopped because he was doing things like trying to microwave a steak.  He has had increased apathy, not attending appropriately to his wife who has previously had a stroke.  He has been incontinent, and wears diapers.  Initially this was stated because of his diabetes he could not get to the bathroom in time, but more recently he does not even attempt to and urinates or defecates on himself.  He manages his own medications, but has not been doing well.  He has not been checking his sugars for at least several months, and has not taken really any of them for the past few weeks.  Over the past few weeks, his decline has been more precipitous with agitation as well.  Exam: Vitals:   09/12/17 1200 09/12/17 1300  BP: 94/62 (!) 86/64  Pulse: 85 80  Resp: 15 16  Temp:    SpO2: 99% 98%   Gen: In bed, NAD Resp: non-labored breathing, no acute distress Abd: soft, nt  Neuro: MS: does Not open eyes, follow commands CN: Eyes are midline, pupils equal round reactive to light, corneals intact Motor: He flexes abnormally to noxious stimuli on the right, withdraws on the left Sensory: As above  Pertinent Labs: BMP-hypokalemia  Mild hypo-mag  Impression: 76 year old male with focal status epilepticus in the setting of severe hyperglycemia.  He was intubated for airway protection after failure of Ativan and midozalam to stop seizures en route.  He received a total of 8 mg of Ativan and 7.5 mg of Versed without cessation of seizures, therefore propofol was used to abort clinical seizure activity.  On initiation of EEG, it appears there is some electrographic seizure continuing best seen in the left hemisphere which aborts following  Dilantin load.  Burst suppression was never pursued, but he was on a low-dose of propofol overnight sedation.  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  Also, possible infection could have played a role as well.   No further seizures since admission  Recommendations: 1) continue Keppra, Dilantin 2) treatment of underlying metabolic derangements per critical care 3) goal of normal mag level 4) MRI brain 5) can discontinue EEG monitoring  Ritta SlotMcNeill Khiana Camino, MD [facility] [phone]  If 7pm- 7am, please page neurology on call as listed in AMION.

## 2017-09-12 NOTE — Progress Notes (Signed)
PCCM Attending Note:  MRI report reviewed indicating bilateral acute infarcts conforming to MCA distributions. Patient on home Lipitor currently. Checking bilateral carotid ultrasounds. Echocardiogram already ordered. Checking Lipid panel and A1c with AM labs. Restarting patient's home Plavix. Spoke with bedside RN and advised goal SBP <180 for permissive hypertension. She is going to text page Neurology to make sure thy are aware.  Donna ChristenJennings E. Jamison NeighborNestor, M.D. [facility] Pager:  [phone] After 7pm or if no response, call [phone] 6:57 PM 09/12/17

## 2017-09-12 NOTE — Procedures (Signed)
History: 76 year old male who presented in status epilepticus.  Clinical seizures had stopped by the time of EEG.  Sedation: Propofol  Technique: This is a 21 channel continuous scalp video-EEG performed at the bedside with bipolar and monopolar montages arranged in accordance to the international 10/20 system of electrode placement. One channel was dedicated to EKG recording.  This recorded from 12:44 PM 10/27 until 10:08 AM 12/28.   Background: At the onset of recording there is extensive muscle artifact, however there is clearly an asymmetric background with irregular slow activity in the left hemisphere that at times becomes quasi-rhythmic, though difficult to tell through the muscle.  Following administration of phenytoin, the muscle activity decreases and there is clearly a periodic discharge maximal the left frontal region though seen with wide field bilaterally.  Correlated with this pattern the patient appeared to have rhythmic jaw clenching.  There is some evolution to this pattern with abrupt offset with subsequently suppressed EEG.    Following this, the EEG is very suppressed.  With any stimulation, there appears diffuse muscle activity, but then the muscle activity is down once the patient is quiescent.  There are occasional runs of spindle-like activity lasting 1-2 seconds.  This pattern is present  Until around 8 AM at which point diffuse irregular delta becomes more prominent.  Photic stimulation: Physiologic driving is not performed  EEG Abnormalities: 1) nonconvulsive status ellipticus at the beginning of the recording stopping at 1:32 PM 2) suppressed EEG 3) diffuse irregular delta activity towards the end of the recording.  Clinical Interpretation: This EEG is consistent with subtle convulsive status epilepticus at the beginning of the recording followed by   diffuse cerebral dysfunction nonspecific in etiology, but likely due to medication effect. \ Ritta SlotMcNeill Kirkpatrick,  MD [facility] [phone]  If 7pm- 7am, please page neurology on call as listed in AMION.

## 2017-09-12 NOTE — Progress Notes (Signed)
eLink Physician-Brief Progress Note Patient Name: Alejandro FuseChristopher [name] DOB: 12/11/1940 MRN: [id number]   Date of Service  09/12/2017  HPI/Events of Note  Blood glucose = 119 --> 52 --> 118 --> 103 --> 152. Insulin IV infusion has been off for 2 hours. Curreently on D5 0.45 NaCl at 100 mL/hour.   eICU Interventions  Will order: 1. D/C Insulin IV infusion. 2. Q 4 hour sensitive Novolog SSI.      Intervention Category Major Interventions: Hyperglycemia - active titration of insulin therapy  Sommer,Steven Eugene 09/12/2017, 1:58 AM

## 2017-09-12 NOTE — Progress Notes (Signed)
CRITICAL VALUE ALERT  Critical Value:  Troponin 0.30  Date & Time Notied:  09/12/2017 1259  Provider Notified: Dr. Jamison NeighborNestor  Orders Received/Actions taken: No new orders at this time

## 2017-09-12 NOTE — Progress Notes (Signed)
Pharmacy Antibiotic Note Alejandro [name] is a 76 y.o. male admitted on December 30, 2016 with status epilepticus thought to be 2/2 to non-ketotic hyperosmolar coma. Pharmacy managing aztreonam and vancomycin. SCr has trended down to 1.57. nCrCl ~ 35 mL/min.   Plan: 1. Increase vancomycin to 1250 mg IV Q 24 hours  2. Azactam 1 gram IV every 8 hours  Height: 5\' 10"  (177.8 cm) Weight: 186 lb 1.1 oz (84.4 kg) IBW/kg (Calculated) : 73  Temp (24hrs), Avg:98.6 F (37 C), Min:98.3 F (36.8 C), Max:99.2 F (37.3 C)   Recent Labs Lab 04/05/17 1129 04/05/17 1137 04/05/17 1455 04/05/17 1650 09/12/17 0350 09/12/17 1143  WBC 17.0*  --   --   --  17.1*  --   CREATININE 2.37* 1.80*  --  2.06* 1.57*  --   LATICACIDVEN  --  8.98* 4.05*  --   --  1.6    Estimated Creatinine Clearance: 40.7 mL/min (A) (by C-G formula based on SCr of 1.57 mg/dL (H)).    Allergies  Allergen Reactions  . Coumadin [Warfarin Sodium] Palpitations    Heart races  . Penicillins Hives and Anaphylaxis    Antimicrobials this admission: 10/27 Azactam >>  10/27 vancomycin >>   Microbiology results: 10/27 BCx: px 10/27 UCx: px   Thank you for allowing pharmacy to be a part of this patient's care.  Vinnie LevelBenjamin Kilynn Fitzsimmons, PharmD., BCPS Clinical Pharmacist Pager [phone]

## 2017-09-12 NOTE — Progress Notes (Signed)
eLink Physician-Brief Progress Note Patient Name: Alejandro FuseChristopher [name] DOB: 1941/02/27 MRN: [id number]   Date of Service  09/12/2017  HPI/Events of Note  K+ = 2.9, Mg++ = 1.6, PO4--- = 2.3 and Creatinine = 1.57.  eICU Interventions  Will replace K+, Mg++ and PO4---.     Intervention Category Major Interventions: Electrolyte abnormality - evaluation and management  Sommer,Steven Eugene 09/12/2017, 5:23 AM

## 2017-09-12 NOTE — Progress Notes (Addendum)
10am:During CSW interaction with RN Megan, nurse stated that she was concerned with the lack of care and supervision patient was receiving prior to coming [facility] due to his poor hygiene on arrival. Patient was living with his wife, whom he is the primary care giver after she had a stroke. Patient had a stroke in 2000 leaving him with left side limitations. Patient needs SNF due to Alzheimer's and complex medical needs. Per nurse, patient's glucose levels are fluctuating significantly.   CSW attempted to meet with patient and family to discuss SNF placement, no family was at bedside during initial attempt. RN Aundra MilletMegan stated that patient's daughter had just stepped out of the room to go to her car, RN and CSW went to waiting room to see if daughter was present and she was not. CSW attempted phone call to daugther at 12 and got no answer, no voicemail option available. CSW unable to assess patient directly due to intubation and sedation.  2:30pm: CSW attempted a second time to meet with family, but none were present. Two phone calls and one text message were sent to the patient's daughter's cell phone that is listed with no answer or response.  CSW to follow up.  Edwin Dadaarol Alecxis Baltzell, MSW, LCSW-A Weekend Clinical Social Worker [phone]

## 2017-09-12 NOTE — Progress Notes (Addendum)
[facility] Attending Note  Admission Date:  08/19/2017  Presenting HPI:  76 y.o. male brought in by his son after being found unresponsive in his home. Unknown down time. Known history of diabetes mellitus type 2, essential hypertension, and history of stroke with residual left-sided hemiparesis. Upon EMS arrival patient had grand mal seizure and was treated with Ativan. Patient had 2 subsequent seizures in route to the emergency department. Patient subsequently received fosphenytoin and Keppra to control his seizure. He was endotracheally intubated and started on a propofol infusion. Initial glucose was greater than 700.  Subjective:  Patient with extremely labile blood glucose overnight. Insulin drip had to be discontinued and dextrose infusion was initiated. Even on resistance sliding scale insulin patient's blood glucose is wildly fluctuating.  Review of Systems:  Unable to obtain given altered mentation, intubation & sedation.   Vent Mode: CPAP;PSV FiO2 (%):  [40 %-100 %] 40 % Set Rate:  [15 bmp] 15 bmp Vt Set:  [500 mL-580 mL] 580 mL PEEP:  [5 cmH20] 5 cmH20 Pressure Support:  [5 cmH20] 5 cmH20 Plateau Pressure:  [8 cmH20-16 cmH20] 14 cmH20  Temp:  [98.3 F (36.8 C)-99.2 F (37.3 C)] 98.8 F (37.1 C) (10/28 0904) Pulse Rate:  [35-111] 86 (10/28 0900) Resp:  [13-36] 15 (10/28 0900) BP: (47-174)/(38-121) 113/78 (10/28 0900) SpO2:  [96 %-100 %] 100 % (10/28 0900) FiO2 (%):  [40 %-100 %] 40 % (10/28 0828) Weight:  [176 lb 5.9 oz (80 kg)-186 lb 1.1 oz (84.4 kg)] 186 lb 1.1 oz (84.4 kg) (10/28 0330)  General:  No family at bedside. Intubated. No distress. Integument:  Warm & dry. No rash on exposed skin. HEENT:  Moist mucus memebranes. No scleral icterus. Endotracheal tube in place. Neurological:  Pupils symmetric. No spontaneous movements. Musculoskeletal:  No joint effusion or erythema appreciated.  Pulmonary:  Symmetric chest wall rise on ventilator. Coarse  breath sounds bilaterally. Cardiovascular:  Regular rate. No appreciable JVD. Normal S1 & S2. Abdomen:  Soft. Nondistended. Normoactive bowel sounds.  LINES/TUBES: OETT  8.0 10/27 >>> R RAD ART LINE 10/27 >>> OGT 10/27 >>> PIV  CBC Latest Ref Rng & Units 09/12/2017 09/09/2017 08/22/2017  WBC 4.0 - 10.5 K/uL 17.1(H) - 17.0(H)  Hemoglobin 13.0 - 17.0 g/dL 11.2(L) 15.6 13.7  Hematocrit 39.0 - 52.0 % 34.2(L) 46.0 42.7  Platelets 150 - 400 K/uL 153 - 198   BMP Latest Ref Rng & Units 09/12/2017 08/18/2017 08/22/2017  Glucose 65 - 99 mg/dL 227(H) 448(H) >700(HH)  BUN 6 - 20 mg/dL 25(H) 27(H) 44(H)  Creatinine 0.61 - 1.24 mg/dL 1.57(H) 2.06(H) 1.80(H)  Sodium 135 - 145 mmol/L 141 142 136  Potassium 3.5 - 5.1 mmol/L 2.9(L) 3.7 5.4(H)  Chloride 101 - 111 mmol/L 113(H) 110 104  CO2 22 - 32 mmol/L 20(L) 20(L) -  Calcium 8.9 - 10.3 mg/dL 7.8(L) 8.2(L) -   CBG (last 3)   Recent Labs  09/12/17 0622 09/12/17 0713 09/12/17 1026  GLUCAP 213* 274* 93    Hepatic Function Latest Ref Rng & Units 08/26/2017 09/21/2015 03/03/2015  Total Protein 6.5 - 8.1 g/dL 8.7(H) 7.1 6.8  Albumin 3.5 - 5.0 g/dL 2.8(L) 3.8 3.2(L)  AST 15 - 41 U/L '31 21 27  '$ ALT 17 - 63 U/L 16(L) 20 20  Alk Phosphatase 38 - 126 U/L 87 73 80  Total Bilirubin 0.3 - 1.2 mg/dL 0.8 0.6 0.8    IMAGING/STUDIES: CT HEAD W/O 10/27:  Atrophy and significant small  vessel disease.  No evidence for acute  abnormality. PORT CXR 10/28:  Personally reviewed by me. Radiology reported that endotracheal tube was at the level of the carina nearly entering right main stem. Endotracheal tube appears to be appropriately positioned by my review. No focal opacity. No pleural effusion. Enteric feeding tube coursing below diaphragm.   MICROBIOLOGY: MRSA PCR 10/27:  Negative Blood Cultures x2 10/27 >>> Urine Culture 10/27:  Negative   ANTIBIOTICS: Aztreonam 10/27 >>> Vancomycin 10/27 >>>  SIGNIFICANT EVENTS: 10/27 - Admit w/ Grand Mal seizure    ASSESSMENT/PLAN:  76 y.o. male admitted with acute encephalopathy secondary to multiple potential etiologies. Admitted with grand mal seizures, hyperglycemia with questionable DKA versus hyperosmolar state, acute renal failure, and very brittle glucose control.  1. Acute encephalopathy: Multifactorial in etiology. Continuing propofol infusion & treatment of following medical problems. 2. Seizures: Likely secondary to hyperosmolar state and metabolic arrangements in the setting of acidosis. Appreciate input from neurology. Patient continuing on antiepileptic drugs per neurology. Continuing seizure precautions. Ativan IV when necessary any seizure activity. 3. DKA versus HHNK: Continuing dextrose infusion to avoid hypoglycemia for now. Switching to sensitive dosed sliding scale algorithm. Continuing Accu-Cheks every 4 hours. Holding on basal insulin for now. Trending troponin I. 4. Hypoglycemia: Improved with dextrose infusion. Continuing to monitor glucose with Accu-Cheks every 4 hours. Continuing D5 half-normal saline at 100 mL per hour. 5. SIRS/Possible Sepsis:   UA suggestive of urinary source of infection. Continuing broad-spectrum antibiotics with vancomycin and aztreonam. Trending Procalcitonin per algorithm. Awaiting finalization of cultures. 6. Acute respiratory failure: Patient unable to protect airway in the setting of recurrent seizures. Continuing full ventilator support pending improvement of mental status. 7. Lactic acidosis: Multifactorial and likely secondary to underlying sirs as well as seizure activity. Repeat lactic acid now. 8. Acute on chronic renal failure stage 3: Improving. Likely some component of dehydration. Trending urine output with external urinary catheter. Monitoring like lites and renal function daily. Repeat CK now. Avoiding nephrotoxic agents. Holding home Lasix and hydrochlorothiazide. 9. Hypokalemia: Replaced with IV potassium chloride. Repeat electrolyte panel at 5  PM. 10. Hypophosphatemia: Mild. Replaced with sodium phosphorus 10 normal IV. Repeat phosphorus level at 5 PM. 11. Hypomagnesemia: Replaced with magnesium sulfate IV. Repeat magnesium level at 5 PM.  12. Anemia: Mild. No evidence of active bleeding. Continuing to trend cell counts daily with CBC. 13. History of CVA: Residual left hemiparesis. Holding home Plavix. 14. Essential hypertension: Monitoring blood pressure with arterial line. Maintaining mean arterial pressure greater than 65. Holding home Norvasc, Lasix, Cozaar, and hydrochlorothiazide. 15. Hyperlipidemia: Continuing home Lipitor 40 mg daily. 16. History of Diabetic neuropathy: Not currently on medications for treatment. 17. History of asthma: No signs of acute exacerbation. Not currently on any inhaler medications for treatment.  Prophylaxis:  Protonix via tube daily & SCDs. Discontinuing Lovenox. Starting Heparin [place] q8hr.  Diet:  NPO. Holding on tube feedings. Code Status:  Full Code pre previous physician discussion. Disposition:  Remains critically ill in the ICU. Social Worker consulted on admission.  Family Update:  No family at bedside at the time of my rounds.   I have personally spent a total of 33 minutes of critical care time today caring for the patient & reviewing the patient's electronic medical record.  Sonia Baller Ashok Cordia, M.D. [facility] Pager:  [phone] After 7pm or if no response, call [phone] 11:35 AM 09/12/17

## 2017-09-12 NOTE — Progress Notes (Signed)
PCCM Attending Note:  Patient noted to be hypotensive & previously had borderline BP. Glucose improved after bolus dextrose. LA has normalized. CK elevated still & moreso than before. Notably also has a mild elevation in Troponin I likely secondary to muscle breakdown.   1. LR bolus 1L x1 2. Switching MIVF to D5 LR @ 100cc/hr 3. Repeat CK with AM labs 4. Checking complete echocardiogram   Alejandro [name], M.D. [facility] Pager:  [phone] After 7pm or if no response, call [phone] 3:13 PM 09/12/17

## 2017-09-13 ENCOUNTER — Inpatient Hospital Stay (HOSPITAL_COMMUNITY): Payer: Medicare Other

## 2017-09-13 DIAGNOSIS — A419 Sepsis, unspecified organism: Principal | ICD-10-CM

## 2017-09-13 DIAGNOSIS — E1011 Type 1 diabetes mellitus with ketoacidosis with coma: Secondary | ICD-10-CM

## 2017-09-13 DIAGNOSIS — I639 Cerebral infarction, unspecified: Secondary | ICD-10-CM

## 2017-09-13 DIAGNOSIS — R6521 Severe sepsis with septic shock: Secondary | ICD-10-CM

## 2017-09-13 DIAGNOSIS — J9601 Acute respiratory failure with hypoxia: Secondary | ICD-10-CM

## 2017-09-13 DIAGNOSIS — R739 Hyperglycemia, unspecified: Secondary | ICD-10-CM

## 2017-09-13 DIAGNOSIS — G40901 Epilepsy, unspecified, not intractable, with status epilepticus: Secondary | ICD-10-CM

## 2017-09-13 DIAGNOSIS — J181 Lobar pneumonia, unspecified organism: Secondary | ICD-10-CM

## 2017-09-13 LAB — CBC WITH DIFFERENTIAL/PLATELET
Basophils Absolute: 0 10*3/uL (ref 0.0–0.1)
Basophils Relative: 0 %
EOS ABS: 0 10*3/uL (ref 0.0–0.7)
EOS PCT: 0 %
HCT: 34.1 % — ABNORMAL LOW (ref 39.0–52.0)
Hemoglobin: 11.1 g/dL — ABNORMAL LOW (ref 13.0–17.0)
LYMPHS ABS: 1.3 10*3/uL (ref 0.7–4.0)
LYMPHS PCT: 9 %
MCH: 25.2 pg — AB (ref 26.0–34.0)
MCHC: 32.6 g/dL (ref 30.0–36.0)
MCV: 77.5 fL — AB (ref 78.0–100.0)
MONOS PCT: 11 %
Monocytes Absolute: 1.6 10*3/uL — ABNORMAL HIGH (ref 0.1–1.0)
NEUTROS PCT: 80 %
Neutro Abs: 11.7 10*3/uL — ABNORMAL HIGH (ref 1.7–7.7)
PLATELETS: 139 10*3/uL — AB (ref 150–400)
RBC: 4.4 MIL/uL (ref 4.22–5.81)
RDW: 15.1 % (ref 11.5–15.5)
WBC: 14.6 10*3/uL — ABNORMAL HIGH (ref 4.0–10.5)

## 2017-09-13 LAB — ECHOCARDIOGRAM COMPLETE
Ao-asc: 34 cm
E decel time: 218 ms
E/e' ratio: 13.08
FS: 20 % — AB (ref 28–44)
Height: 70 in
IVS/LV PW RATIO, ED: 1.09
LA ID, A-P, ES: 39 mm
LA diam end sys: 39 mm
LA diam index: 1.89 cm/m2
LA vol A4C: 33.3 mL
LA vol index: 24.5 mL/m2
LA vol: 50.5 mL
LV E/e' medial: 13.08
LV E/e'average: 13.08
LV PW d: 11.7 mm — AB (ref 0.6–1.1)
LV e' LATERAL: 5.75 cm/s
LVOT area: 3.14 cm2
LVOT diameter: 20 mm
Lateral S' vel: 9.43 cm/s
MV Dec: 218
MV Peak grad: 2 mmHg
MV pk A vel: 68.4 m/s
MV pk E vel: 75.2 m/s
PV Reg vel dias: 109 cm/s
TAPSE: 23.4 mm
TDI e' lateral: 5.75
TDI e' medial: 5.43
Weight: 2991.2 [oz_av]

## 2017-09-13 LAB — LIPID PANEL
Cholesterol: 140 mg/dL (ref 0–200)
HDL: 43 mg/dL (ref >40)
LDL CALC: 83 mg/dL (ref 0–99)
TRIGLYCERIDES: 69 mg/dL (ref <150)
Total CHOL/HDL Ratio: 3.3 RATIO
VLDL: 14 mg/dL (ref 0–40)

## 2017-09-13 LAB — RENAL FUNCTION PANEL
Albumin: 2 g/dL — ABNORMAL LOW (ref 3.5–5.0)
Anion gap: 7 (ref 5–15)
BUN: 20 mg/dL (ref 6–20)
CO2: 22 mmol/L (ref 22–32)
Calcium: 7.9 mg/dL — ABNORMAL LOW (ref 8.9–10.3)
Chloride: 111 mmol/L (ref 101–111)
Creatinine, Ser: 1.27 mg/dL — ABNORMAL HIGH (ref 0.61–1.24)
GFR calc Af Amer: >60 mL/min
GFR calc non Af Amer: 53 mL/min — ABNORMAL LOW
Glucose, Bld: 258 mg/dL — ABNORMAL HIGH (ref 65–99)
Phosphorus: 2.8 mg/dL (ref 2.5–4.6)
Potassium: 3.2 mmol/L — ABNORMAL LOW (ref 3.5–5.1)
Sodium: 140 mmol/L (ref 135–145)

## 2017-09-13 LAB — GLUCOSE, CAPILLARY
GLUCOSE-CAPILLARY: 293 mg/dL — AB (ref 65–99)
GLUCOSE-CAPILLARY: 217 mg/dL — AB (ref 65–99)
GLUCOSE-CAPILLARY: 246 mg/dL — AB (ref 65–99)
Glucose-Capillary: 159 mg/dL — ABNORMAL HIGH (ref 65–99)
Glucose-Capillary: 258 mg/dL — ABNORMAL HIGH (ref 65–99)
Glucose-Capillary: 335 mg/dL — ABNORMAL HIGH (ref 65–99)

## 2017-09-13 LAB — BLOOD GAS, ARTERIAL
ACID-BASE DEFICIT: 1.9 mmol/L (ref 0.0–2.0)
Bicarbonate: 22.1 mmol/L (ref 20.0–28.0)
DRAWN BY: 414221
FIO2: 40
MECHVT: 580 mL
O2 SAT: 98.9 %
PATIENT TEMPERATURE: 98.6
PCO2 ART: 35.9 mmHg (ref 32.0–48.0)
PEEP/CPAP: 5 cmH2O
PH ART: 7.406 (ref 7.350–7.450)
PO2 ART: 127 mmHg — AB (ref 83.0–108.0)
RATE: 15 resp/min

## 2017-09-13 LAB — MAGNESIUM
Magnesium: 1.7 mg/dL (ref 1.7–2.4)
Magnesium: 2.2 mg/dL (ref 1.7–2.4)

## 2017-09-13 LAB — ALBUMIN: Albumin: 2.1 g/dL — ABNORMAL LOW (ref 3.5–5.0)

## 2017-09-13 LAB — HEMOGLOBIN A1C
HEMOGLOBIN A1C: 14.4 % — AB (ref 4.8–5.6)
Mean Plasma Glucose: 366.58 mg/dL

## 2017-09-13 LAB — PHOSPHORUS: Phosphorus: 2.9 mg/dL (ref 2.5–4.6)

## 2017-09-13 LAB — PHENYTOIN LEVEL, TOTAL: PHENYTOIN LVL: 16.8 ug/mL (ref 10.0–20.0)

## 2017-09-13 LAB — PROCALCITONIN: Procalcitonin: 0.56 ng/mL

## 2017-09-13 LAB — LACTIC ACID, PLASMA: LACTIC ACID, VENOUS: 2.4 mmol/L — AB (ref 0.5–1.9)

## 2017-09-13 LAB — CK: Total CK: 327 U/L (ref 49–397)

## 2017-09-13 LAB — TROPONIN I: TROPONIN I: 0.19 ng/mL — AB (ref <0.03)

## 2017-09-13 MED ORDER — VITAL AF 1.2 CAL PO LIQD
1000.0000 mL | ORAL | Status: DC
  Administered 2017-09-13 – 2017-09-14 (×2): 1000 mL

## 2017-09-13 MED ORDER — SODIUM CHLORIDE 0.9 % IV SOLN
200.0000 mg | Freq: Once | INTRAVENOUS | Status: AC
  Administered 2017-09-13: 200 mg via INTRAVENOUS
  Filled 2017-09-13: qty 20

## 2017-09-13 MED ORDER — SODIUM CHLORIDE 0.9 % IV SOLN
1000.0000 mg | Freq: Once | INTRAVENOUS | Status: AC
  Administered 2017-09-13: 1000 mg via INTRAVENOUS
  Filled 2017-09-13: qty 10

## 2017-09-13 MED ORDER — MAGNESIUM SULFATE 2 GM/50ML IV SOLN
2.0000 g | Freq: Once | INTRAVENOUS | Status: AC
  Administered 2017-09-13: 2 g via INTRAVENOUS
  Filled 2017-09-13: qty 50

## 2017-09-13 MED ORDER — POTASSIUM CHLORIDE 10 MEQ/100ML IV SOLN
10.0000 meq | INTRAVENOUS | Status: AC
  Administered 2017-09-13 (×4): 10 meq via INTRAVENOUS
  Filled 2017-09-13 (×4): qty 100

## 2017-09-13 MED ORDER — INSULIN ASPART 100 UNIT/ML ~~LOC~~ SOLN
0.0000 [IU] | SUBCUTANEOUS | Status: DC
  Administered 2017-09-14: 8 [IU] via SUBCUTANEOUS
  Administered 2017-09-14: 11 [IU] via SUBCUTANEOUS
  Administered 2017-09-14: 3 [IU] via SUBCUTANEOUS
  Administered 2017-09-14 (×2): 8 [IU] via SUBCUTANEOUS
  Administered 2017-09-14: 5 [IU] via SUBCUTANEOUS
  Administered 2017-09-15 (×3): 2 [IU] via SUBCUTANEOUS

## 2017-09-13 MED ORDER — DEXTROSE 5 % IV SOLN
10.0000 mmol | Freq: Once | INTRAVENOUS | Status: AC
  Administered 2017-09-13: 10 mmol via INTRAVENOUS
  Filled 2017-09-13: qty 3.33

## 2017-09-13 MED ORDER — VANCOMYCIN HCL IN DEXTROSE 750-5 MG/150ML-% IV SOLN
750.0000 mg | Freq: Two times a day (BID) | INTRAVENOUS | Status: DC
  Administered 2017-09-13 – 2017-09-16 (×7): 750 mg via INTRAVENOUS
  Filled 2017-09-13 (×8): qty 150

## 2017-09-13 MED ORDER — SODIUM CHLORIDE 0.9 % IV SOLN
100.0000 mg | Freq: Two times a day (BID) | INTRAVENOUS | Status: DC
  Administered 2017-09-14 – 2017-09-16 (×5): 100 mg via INTRAVENOUS
  Filled 2017-09-13 (×6): qty 10

## 2017-09-13 NOTE — Progress Notes (Signed)
[facility] Attending Note  Admission Date:  09/13/2017  Presenting HPI:  76 y.o. male brought in by his son after being found unresponsive in his home. Unknown down time. Known history of diabetes mellitus type 2, essential hypertension, and history of stroke with residual left-sided hemiparesis. Upon EMS arrival patient had grand mal seizure and was treated with Ativan. Patient had 2 subsequent seizures in route to the emergency department. Patient subsequently received fosphenytoin and Keppra to control his seizure. He was endotracheally intubated and started on a propofol infusion. Initial glucose was greater than 700.  Subjective:  Patient with low BP for which he was bolused 1L IVF and the IVF gtt rate was increased. Tolerated 4hrs SBT yesterday. Continued to be very somnolent today. Copious brown creamy secretions from ETT. EEG planned for later today.   Review of Systems:  Unable to obtain given altered mentation, intubation & sedation.   Vent Mode: PSV;CPAP FiO2 (%):  [40 %] 40 % Set Rate:  [15 bmp] 15 bmp Vt Set:  [580 mL] 580 mL PEEP:  [5 cmH20] 5 cmH20 Pressure Support:  [5 cmH20] 5 cmH20 Plateau Pressure:  [9 [id number] cmH20] 9 cmH20  Temp:  [98.3 F (36.8 C)-99.6 F (37.6 C)] 98.9 F (37.2 C) (10/29 0750) Pulse Rate:  [75-96] 78 (10/29 1000) Resp:  [0-25] 18 (10/29 1000) BP: (79-150)/(53-126) 109/81 (10/29 1004) SpO2:  [96 %-100 %] 98 % (10/29 1000) FiO2 (%):  [40 %] 40 % (10/29 0750) Weight:  [84.8 kg (186 lb 15.2 oz)] 84.8 kg (186 lb 15.2 oz) (10/29 0600)  General: Intubated, Unresponsive, Not sedated. Critically ill.  Integument:  Warm & dry. No rash on exposed skin. HEENT:  Moist mucus memebranes. No scleral icterus. Endotracheal tube in place with copious brown creamy ETT secretions.  Neurological:  Pupils symmetric. No spontaneous movements. Eyes closed; Grimaces to sternal rub. Not obeying commands.  Musculoskeletal:  No joint effusion or  erythema appreciated.  Pulmonary:  CTA bilaterally. Cardiovascular:  Regular rate. No appreciable JVD. Normal S1 & S2. GU: condom catheter in place  Abdomen:  Soft. Nondistended. Normoactive bowel sounds.  LINES/TUBES: OETT  8.0 10/27 >>> R RAD ART LINE 10/27 >>> OGT 10/27 >>> PIV  CBC Latest Ref Rng & Units 09/13/2017 09/12/2017 09/09/2017  WBC 4.0 - 10.5 K/uL 14.6(H) 17.1(H) -  Hemoglobin 13.0 - 17.0 g/dL 11.1(L) 11.2(L) 15.6  Hematocrit 39.0 - 52.0 % 34.1(L) 34.2(L) 46.0  Platelets 150 - 400 K/uL 139(L) 153 -   BMP Latest Ref Rng & Units 09/13/2017 09/12/2017 09/12/2017  Glucose 65 - 99 mg/dL 258(H) 184(H) 227(H)  BUN 6 - 20 mg/dL 20 20 25(H)  Creatinine 0.61 - 1.24 mg/dL 1.27(H) 1.29(H) 1.57(H)  Sodium 135 - 145 mmol/L 140 142 141  Potassium 3.5 - 5.1 mmol/L 3.2(L) 3.5 2.9(L)  Chloride 101 - 111 mmol/L 111 114(H) 113(H)  CO2 22 - 32 mmol/L 22 22 20(L)  Calcium 8.9 - 10.3 mg/dL 7.9(L) 8.1(L) 7.8(L)   CBG (last 3)   Recent Labs  09/12/17 2308 09/13/17 0319 09/13/17 0744  GLUCAP 111* 159* 293*    Hepatic Function Latest Ref Rng & Units 09/13/2017 09/12/2017 09/03/2017  Total Protein 6.5 - 8.1 g/dL - - 8.7(H)  Albumin 3.5 - 5.0 g/dL 2.0(L) 2.1(L) 2.8(L)  AST 15 - 41 U/L - - 31  ALT 17 - 63 U/L - - 16(L)  Alk Phosphatase 38 - 126 U/L - - 87  Total Bilirubin 0.3 - 1.2 mg/dL - -  0.8    IMAGING/STUDIES: CT HEAD W/O 10/27:  Atrophy and significant small vessel disease.  No evidence for acute  abnormality. PORT CXR 10/28:  Personally reviewed by me. Radiology reported that endotracheal tube was at the level of the carina nearly entering right main stem. Endotracheal tube appears to be appropriately positioned by my review. No focal opacity. No pleural effusion. Enteric feeding tube coursing below diaphragm.   MICROBIOLOGY: MRSA PCR 10/27:  Negative Blood Cultures x2 10/27 >>> Urine Culture 10/27:  Negative   ANTIBIOTICS: Aztreonam 10/27 >>> Vancomycin 10/27  >>>  SIGNIFICANT EVENTS: 10/27 - Admit w/ Grand Mal seizure  10/28 - Hypotensive, bolused with 1L IVF 10/29 - copious creamy ETT secretions  ASSESSMENT/PLAN:  76 y.o. male admitted with acute encephalopathy secondary to multiple potential etiologies. Admitted with grand mal seizures, hyperglycemia with questionable DKA versus hyperosmolar state, acute renal failure, and very brittle glucose control.  1. Acute encephalopathy: Multifactorial in etiology. Off sedation now. 2. Seizures: Likely secondary to hyperosmolar state and metabolic arrangements in the setting of acidosis. Appreciate input from neurology. Patient continuing on antiepileptic drugs per neurology. Continuing seizure precautions. Ativan IV when necessary any seizure activity. EEG (10/28): showed some nonconvulsive status. Repeat EEG ordered for today.  3. CVA: Brain MRI showed infarcts within the MCA distribution; TTE and Carotid dopplers pending. Remainder per Neurology.  4. DKA versus HHNK; Hypoglycemia: very brittle diabetic. Initially hyperglycemic now having episodes of HYPOglycemia likely related to being NPO. Start Tube feeds. Continue D5LR @ 100cc/hr for now; hopefully can wean once tubefeeds started. Continue sensitive dosed sliding scale algorithm. Continuing Accu-Cheks every 4 hours. Holding on basal insulin for now. Trending troponin I. 5. Septic Shock: Due to UTI now has also developed a Pneumonia (VAP vs Aspiration?) with purulent copious ETT secretions and bibasilar infiltates on CXR. Continue Vanc and Aztreonam. Check Sputum culture. Blood cultures and Urine culture (both from 10/27) are no growth to date. Lactate 1.6 (down from 4.05) on 10/28. Will recheck today. Procal improving (0.56 down from 0.84).  6. Acute respiratory failure: Patient unable to protect airway in the setting of recurrent seizures. Continuing full ventilator support pending improvement of mental status. Doing well on SBT's (4hrs yesterday and on it  again today) but the combination of poor mental status and copious ETT secretions is a barrier to extubation.  7. Acute on chronic renal failure stage 3: Improving. Likely some component of dehydration. Trending urine output with external urinary catheter. Asked RN to bladder scan q8hrs; if retains urine will need foley catheter. Monitoring like lites and renal function daily. Avoiding nephrotoxic agents. Holding home Lasix and hydrochlorothiazide. 8. Hypokalemia: Replaced again today 9. Hypophosphatemia: Repleted again today 10. Hypomagnesemia: Replaced with magnesium sulfate IV again today 11. Anemia: Mild. No evidence of active bleeding. Continuing to trend cell counts daily with CBC. 12. History of CVA: Residual left hemiparesis. Holding home Plavix. 13. Essential hypertension: Monitoring blood pressure with arterial line. Maintaining mean arterial pressure greater than 65. Holding home Norvasc, Lasix, Cozaar, and hydrochlorothiazide. 14. Hyperlipidemia: Continuing home Lipitor 40 mg daily. 15. History of Diabetic neuropathy: Not currently on medications for treatment. 16. History of asthma: No signs of acute exacerbation. Not currently on any inhaler medications for treatment.  Prophylaxis:  Protonix via tube daily & SCDs. Starting Heparin  q8hr.  Diet:  NPO. Starting tube feeds now Code Status:  Full Code pre previous physician discussion. Disposition:  Remains critically ill in the ICU. Social Worker consulted on admission.  Family Update: Updated family at bedside at the time of my rounds.   I have personally spent a total of 45 minutes of critical care time today caring for the patient & reviewing the patient's electronic medical record.  Vernie Murders, MD Pulmonary & Critical Care Medicine Pager: [phone]

## 2017-09-13 NOTE — Progress Notes (Signed)
EEG completed; results pending.    

## 2017-09-13 NOTE — Progress Notes (Signed)
CRITICAL VALUE ALERT  Critical Value:  2.4 Lactic Acid  Date & Time Notied:  09/13/17 1321  Provider Notified: Milana ObeyKathleen Hammonds  Orders Received/Actions taken: n/a

## 2017-09-13 NOTE — Progress Notes (Signed)
Dr. Jeraldine LootsHammond notified of patient's elevated CBG's. Order to decrease D5LR to 2120ml/hr. Will monitor.

## 2017-09-13 NOTE — Progress Notes (Signed)
  Echocardiogram 2D Echocardiogram has been performed.  Alejandro [name] G Alejandro [name] 09/13/2017, 11:26 AM

## 2017-09-13 NOTE — Progress Notes (Signed)
Transcranial Doppler Completed. Katharina Jehle, BS, RDMS, RVT  

## 2017-09-13 NOTE — Progress Notes (Signed)
EEG reviewed, severe irritability on the left. I have incerased keppra and added vimpat 200mg  x1, followed by 100mg  BID.   Ritta SlotMcNeill Haddie Bruhl, MD [facility] [phone]  If 7pm- 7am, please page neurology on call as listed in AMION.

## 2017-09-13 NOTE — Procedures (Signed)
ELECTROENCEPHALOGRAM REPORT  Date of Study: 09/13/2017  Patient's Name: Alejandro [name] MRN: [id number] Date of Birth: [birth year]  Referring Provider: Dr. Tera Partridge  Clinical History: This is a 76 year old man with recent status epilepticus.  Medications: phenytoin (DILANTIN) injection 100 mg  levETIRAcetam (KEPPRA) 500 mg in sodium chloride 0.9 % 100 mL IVPB  atorvastatin (LIPITOR) tablet 40 mg  aztreonam (AZACTAM) 1 g in dextrose 5 % 50 mL IVPB  chlorhexidine gluconate (MEDLINE KIT) (PERIDEX) 0.12 % solution 15 mL  clopidogrel (PLAVIX) tablet 75 mg  dextrose 5 % in lactated ringers infusion  feeding supplement (VITAL AF 1.2 CAL) liquid 1,000 mL  heparin injection 5,000 Units  insulin aspart (novoLOG) injection 0-9 Units  pantoprazole sodium (PROTONIX) 40 mg/20 mL oral suspension 40 mg  potassium PHOSPHATE 10 mmol in dextrose 5 % 250 mL infusion  vancomycin (VANCOCIN) IVPB 750 mg/150 ml premix   Technical Summary: A multichannel digital EEG recording measured by the international 10-20 system with electrodes applied with paste and impedances below 5000 ohms performed as portable with EKG monitoring in an intubated and unresponsive patient.  Hyperventilation and photic stimulation were not performed.  The digital EEG was referentially recorded, reformatted, and digitally filtered in a variety of bipolar and referential montages for optimal display.   Description: The patient is intubated and unresponsive during the recording. Per staff, last dose of Fentanyl was 12 hours prior to EEG. There is no clear posterior dominant rhythm. The background consists of a large amount of 4-5 Hz theta and 2-3 Hz delta slowing. Throughout the recording there are bursts and runs of low voltage 8 Hz sharp waves over the left frontotemporal region that do not evolve in frequency or amplitude. Normal sleep architecture is not seen. Hyperventilation and photic stimulation were not performed.  There  were no electrographic seizures seen.    EKG lead was unremarkable.  Impression: This EEG is abnormal due to the presence of: 1. Moderate diffuse background slowing 2. Frequent bursts and runs of low voltage sharp waves over the left frontotemporal region  Clinical Correlation of the above findings indicates diffuse cerebral dysfunction that is non-specific in etiology and can be seen with hypoxic/ischemic injury, toxic/metabolic encephalopathies, or medication effect.  The frequent bursts and runs of low voltage sharp waves over the left frontotemporal region indicate a tendency for seizures to arise from this region. There was no evolution in frequency or amplitude, however these changes are usually associated with an acute brain lesion and clinical focal seizures, or post-ictal after focal status epilepticus. Clinical correlation is advised.   Ellouise Newer, M.D.

## 2017-09-13 NOTE — Progress Notes (Signed)
Initial Nutrition Assessment  DOCUMENTATION CODES:   Not applicable  INTERVENTION:  - Initiate Tube feeding Vital AF 1.2 at 70 ml/hr (1680 ml daily) Providing: 2016 kcal, 126 grams of protein, and 1361 ml of H2O.   NUTRITION DIAGNOSIS:   Inadequate oral intake related to inability to eat as evidenced by NPO status.  GOAL:   Patient will meet greater than or equal to 90% of their needs  MONITOR:   Vent status, Labs, Weight trends, TF tolerance, I & O's  REASON FOR ASSESSMENT:   Consult Enteral/tube feeding initiation and management  ASSESSMENT:   Pt with PMH of DM, HTN, stage III renal failure, HLD presents after being found unresponsive. Pt intubated in field s/p multiple seizures.   No family at bedside to obtain nutritional history. No recent weights recorded within past year.  Patient is currently intubated on ventilator support MV: 14 L/min Temp (24hrs), Avg:98.8 F (37.1 C), Min:98.3 F (36.8 C), Max:99.6 F (37.6 C) Propofol: off  Labs reviewed; CBG 111-293, Hemoglobin A1C 14.4, K 3.2, Phosphorus and Magnesium WNL Medications reviewed; sliding scale insulin, Protonix  NUTRITION - FOCUSED PHYSICAL EXAM:    Most Recent Value  Orbital Region  No depletion  Upper Arm Region  No depletion  Thoracic and Lumbar Region  No depletion  Buccal Region  Unable to assess  Temple Region  Mild depletion  Clavicle Bone Region  No depletion  Clavicle and Acromion Bone Region  No depletion  Scapular Bone Region  Unable to assess  Dorsal Hand  No depletion  Patellar Region  No depletion  Anterior Thigh Region  No depletion  Posterior Calf Region  No depletion  Edema (RD Assessment)  None       Diet Order:  Diet NPO time specified  EDUCATION NEEDS:   No education needs have been identified at this time  Skin:  Skin Assessment: Reviewed RN Assessment  Last BM:  08/25/2017  Height:   Ht Readings from Last 1 Encounters:  09/13/17 5\' 10"  (1.778 m)     Weight:   Wt Readings from Last 1 Encounters:  09/13/17 186 lb 15.2 oz (84.8 kg)    Ideal Body Weight:  75.5 kg  BMI:  Body mass index is 26.82 kg/m.  Estimated Nutritional Needs:   Kcal:  2022  Protein:  100-125 grams  Fluid:  >/= 2 L/d  Fransisca KaufmannAllison Ioannides, MS, RDN, LDN 09/13/2017 1:00 PM

## 2017-09-13 NOTE — Progress Notes (Signed)
eLink Physician-Brief Progress Note Patient Name: Alejandro FuseChristopher Congrove DOB: July 04, 1941 MRN: [id number]   Date of Service  09/13/2017  HPI/Events of Note  Hyperglycemia - Blood glucose = 293 --> 258 --> 335 --> 217. Patient is on sensitive Novolog SSI.   eICU Interventions  Will order: 1. Increase to Q 4 hour moderate Novolog SSI.     Intervention Category Major Interventions: Hyperglycemia - active titration of insulin therapy  Lenell AntuSommer,Gwynneth Fabio Eugene 09/13/2017, 8:27 PM

## 2017-09-13 NOTE — Progress Notes (Signed)
Preliminary results by tech - Carotid Duplex Completed. No evidence of a significant stenosis in bilateral carotid arteries. Vertebral arteries are patent with antegrade flow. Maeci Kalbfleisch, BS, RDMS, RVT  

## 2017-09-13 NOTE — Progress Notes (Signed)
STROKE TEAM PROGRESS NOTE   SUBJECTIVE (INTERVAL HISTORY) His Daughter  is at the bedside. Patient remains unresponsive, with no sedation. Does not follow any direct commands. No spontaneous movement of extremities noted. No seizure activity reported overnight.   Daughter describes events over the past few months as "difficult" for the entire family. Patient has not been taking his medications for quite some time and his dementia symptoms have worsened with +episodes of agitation.  Daughter reports patient had CVA approx 11 years ago in [place]. Probably from AFIB. She is unsure why Coumadin is listed as an allergy. But thinks he did not tolerate it well.    OBJECTIVE  Recent Labs Lab 09/12/17 1914 09/12/17 2308 09/13/17 0319 09/13/17 0744 09/13/17 1116  GLUCAP 157* 111* 159* 293* 258*    Recent Labs Lab October 08, 2017 1129 10/08/17 1137 08-Oct-2017 1650 09/12/17 0350 09/12/17 1700 09/13/17 0518  NA 136 136 142 141 142 140  K 5.4* 5.4* 3.7 2.9* 3.5 3.2*  CL 98* 104 110 113* 114* 111  CO2 19*  --  20* 20* 22 22  GLUCOSE 846* >700* 448* 227* 184* 258*  BUN 31* 44* 27* 25* 20 20  CREATININE 2.37* 1.80* 2.06* 1.57* 1.29* 1.27*  CALCIUM 8.9  --  8.2* 7.8* 8.1* 7.9*  MG  --   --   --  1.6* 1.9 1.7  PHOS  --   --   --  2.3* 2.8 2.8    Recent Labs Lab 08-Oct-2017 1129 09/12/17 1700 09/13/17 0518 09/13/17 1205  AST 31  --   --   --   ALT 16*  --   --   --   ALKPHOS 87  --   --   --   BILITOT 0.8  --   --   --   PROT 8.7*  --   --   --   ALBUMIN 2.8* 2.1* 2.0* 2.1*    Recent Labs Lab 08-Oct-2017 1129 10/08/17 1137 09/12/17 0350 09/13/17 0518  WBC 17.0*  --  17.1* 14.6*  NEUTROABS 15.5*  --   --  11.7*  HGB 13.7 15.6 11.2* 11.1*  HCT 42.7 46.0 34.2* 34.1*  MCV 80.4  --  77.2* 77.5*  PLT 198  --  153 139*    Recent Labs Lab 10-08-17 1129 09/12/17 1143 09/12/17 1745 09/12/17 2324 09/13/17 0518  CKTOTAL 179 454*  --   --  327  TROPONINI  --  0.30* 0.25* 0.19*  --     No results for input(s): LABPROT, INR in the last 72 hours.  Recent Labs  08-Oct-2017 1206  COLORURINE STRAW*  LABSPEC 1.020  PHURINE 5.0  GLUCOSEU >=500*  HGBUR MODERATE*  BILIRUBINUR NEGATIVE  KETONESUR 5*  PROTEINUR 30*  NITRITE NEGATIVE  LEUKOCYTESUR MODERATE*       Component Value Date/Time   CHOL 140 09/13/2017 0518   TRIG 69 09/13/2017 0518   HDL 43 09/13/2017 0518   CHOLHDL 3.3 09/13/2017 0518   VLDL 14 09/13/2017 0518   LDLCALC 83 09/13/2017 0518   Lab Results  Component Value Date   HGBA1C 14.4 (H) 09/13/2017   No results found for: LABOPIA, COCAINSCRNUR, LABBENZ, AMPHETMU, THCU, LABBARB   Recent Labs Lab October 08, 2017 1125  ETH <10    IMAGING: I have personally reviewed the radiological images below and agree with the radiology interpretations.  Ct Head Wo Contrast Result Date: 10-08-2017 IMPRESSION: 1. Atrophy and significant small vessel disease. 2.  No evidence for acute  abnormality.  Mr Brain Wo Contrast Result Date: 09/12/2017 IMPRESSION: 1. Patchy bilateral acute cerebral infarcts as described. 2. Advanced chronic small vessel ischemia.    EEG:                                    PENDING  TCD:                                    PENDING   2D Echo:                            PENDING  B/L Carotid Doppler:         PENDING  PHYSICAL EXAM Temp:  [98.3 F (36.8 C)-99.6 F (37.6 C)] 98.3 F (36.8 C) (10/29 1200) Pulse Rate:  [75-96] 78 (10/29 1000) Resp:  [0-25] 18 (10/29 1000) BP: (79-150)/(59-126) 109/81 (10/29 1004) SpO2:  [94 %-100 %] 94 % (10/29 1133) FiO2 (%):  [40 %] 40 % (10/29 1145) Weight:  [84.8 kg (186 lb 15.2 oz)] 84.8 kg (186 lb 15.2 oz) (10/29 0600)  General - Well nourished, well developed African-American male who is intubated, in no apparent distress Respiratory - non- labored breathing. No acute distress. Cardiovascular - Regular rate and rhythm   Mental Status -  Patient is intubated. Not sedated but unresponsive Does  not open eyes or follow any direct commands Cranial Nerves II - XII - Eyes are midline, pupils equal round reactive to light, doll's eye movements are sluggish corneals intact, +cough/gag reflex Motor Strength  + Flex to to noxious stimuli on the right, withdraws on the left. No withdrawal noted in UE's. Sensory  Unable to assess  ASSESSMENT AND PLAN: Alejandro [name] is a 76 y.o. male admitted with focal status epilepticus in the setting of severe hyperglycemia. Now with bilateral acute cerebral infarcts  He was intubated for airway protection after failure of Ativan and midozalam to stop seizures en route. Now unresponsive without sedation.  Patchy bilateral acute cerebral infarcts: Likely secondary to embolic source, likely undiagnosed AFIB  Resultant: Unresponsive, no spontaneous movement. Recurrent seizures   MRI:                          Patchy bilateral acute cerebral infarcts    CT Head: Atrophy and significant small vessel disease.   EEG:                                    PENDING  TCD:                                    PENDING  2D Echo:                              LVEF 55-60%. no regional wall motion abnormalities.   B/L Carotid Doppler:         PENDING   LDL:     83    HgbA1c:  14.4   VTE prophylaxis: Heparin  Diet NPO time specified    clopidogrel 75 mg daily prior to admission, now  on clopidogrel 75 mg daily.         Therapy recommendations:  PENDING       Disposition: Will need long term placement at discharge           Recommendations:   Monitor pending imaging/procedures, poor prognosis at this time  Ongoing aggressive stroke risk factor management   R/O AFIB:  ECHO negative for embolic source. Will monitor patients progress closely. Patient is not a good candidate for long term anticoagulation at this time due to medical non-compliance and history of worsening dementia. Will continue to monitor closely  Status  Epilepticus:  Repeat EEG - r/o status  Check Free and Total Dilantin and Keppra levels  Continue to monitor closely for any seizure activity or status  Diabetes:  HgbA1c  14.4     goal < 7.0  Uncontrolled  Currently on  Novolog  CBG monitoring and SSI  DM education and close PCP follow up  Hypertension: Stable Permissive hypertension (OK if <220/120) for 24-48 hours post stroke and then gradually normalized within 5-7 days.  Long term BP goal normotensive. May slowly restart home B/P medications after 48 hours with close PCP follow up.  Hyperlipidemia:  Home meds:  Lipitor 40 mg  LDL   83, goal < 70  Now on Lipitor to 40 mg daily  Continue statin at discharge  Other Stroke Risk Factors:  Advanced age  Hx stroke - 11 years ago, likely secondary to AFIB  Other Active Problems:  Underlying metabolic derangements per critical care  Hospital day # 2   Brita Romp Stroke Neurology Team 09/13/2017 1:16 PM  I have personally examined this patient, reviewed notes, independently viewed imaging studies, participated in medical decision making and plan of care.ROS completed by me personally and pertinent positives fully documented  I have made any additions or clarifications directly to the above note. Agree with note above. I had a long discussion with the patient's daughter at the bedside and answered questions about his prognosis. Given prior history of strokes and likely aggressive dementia his quality of life even if he recovers from this is not going to be great. Recommend continue supportive care but if there is no significant neurological improvement over the next few days consider comfort care. He is not a candidate for long-term ventilatory support, tracheostomy and skilled nursing home as per his daughter This patient is critically ill and at significant risk of neurological worsening, death and care requires constant monitoring of vital signs,  hemodynamics,respiratory and cardiac monitoring, extensive review of multiple databases, frequent neurological assessment, discussion with family, other specialists and medical decision making of high complexity.I have made any additions or clarifications directly to the above note.This critical care time does not reflect procedure time, or teaching time or supervisory time of PA/NP/Med Resident etc but could involve care discussion time.  I spent 30 minutes of neurocritical care time  in the care of  this patient.      Delia Heady, MD Medical Director [facility] Pager: [phone] 09/13/2017 3:01 PM  To contact Stroke Continuity provider, please refer to [email or web address]. After hours, contact General Neurology

## 2017-09-13 NOTE — Progress Notes (Signed)
Inpatient Diabetes Program Recommendations  AACE/ADA: New Consensus Statement on Inpatient Glycemic Control (2015)  Target Ranges:  Prepandial:   less than 140 mg/dL      Peak postprandial:   less than 180 mg/dL (1-2 hours)      Critically ill patients:  140 - 180 mg/dL   Lab Results  Component Value Date   GLUCAP 258 (H) 09/13/2017   HGBA1C 14.4 (H) 09/13/2017    Review of Glycemic Control Results for Alejandro [name], [place] (MRN [id number]) as of 09/13/2017 13:00  Ref. Range 09/12/2017 19:14 09/12/2017 23:08 09/13/2017 03:19 09/13/2017 07:44 09/13/2017 11:16  Glucose-Capillary Latest Ref Range: 65 - 99 mg/dL 784157 (H) 696111 (H) 295159 (H) 293 (H) 258 (H)   Inpatient Diabetes Program Recommendations:  Please consider ICU Adult Glycemic Control orders for glycemic management.  Thank you, Billy FischerJudy E. Quenisha Lovins, RN, MSN, CDE  Diabetes Coordinator Inpatient Glycemic Control Team Team Pager [phone] (8am-5pm) 09/13/2017 1:01 PM

## 2017-09-14 ENCOUNTER — Inpatient Hospital Stay (HOSPITAL_COMMUNITY): Payer: Medicare Other

## 2017-09-14 DIAGNOSIS — J9601 Acute respiratory failure with hypoxia: Secondary | ICD-10-CM

## 2017-09-14 LAB — CBC
HCT: 34.4 % — ABNORMAL LOW (ref 39.0–52.0)
HEMOGLOBIN: 11.1 g/dL — AB (ref 13.0–17.0)
MCH: 25.2 pg — AB (ref 26.0–34.0)
MCHC: 32.3 g/dL (ref 30.0–36.0)
MCV: 78 fL (ref 78.0–100.0)
Platelets: 144 10*3/uL — ABNORMAL LOW (ref 150–400)
RBC: 4.41 MIL/uL (ref 4.22–5.81)
RDW: 15 % (ref 11.5–15.5)
WBC: 13 10*3/uL — ABNORMAL HIGH (ref 4.0–10.5)

## 2017-09-14 LAB — VAS US CAROTID
LEFT ECA DIAS: -15 cm/s
LEFT VERTEBRAL DIAS: -9 cm/s
LICAPDIAS: -13 cm/s
Left CCA dist dias: -16 cm/s
Left CCA dist sys: -51 cm/s
Left CCA prox dias: 25 cm/s
Left CCA prox sys: 89 cm/s
Left ICA dist dias: -30 cm/s
Left ICA dist sys: -76 cm/s
Left ICA prox sys: -42 cm/s
RCCAPDIAS: 21 cm/s
RIGHT ECA DIAS: -13 cm/s
RIGHT VERTEBRAL DIAS: -15 cm/s
Right CCA prox sys: 73 cm/s
Right cca dist sys: -64 cm/s

## 2017-09-14 LAB — BASIC METABOLIC PANEL
ANION GAP: 8 (ref 5–15)
BUN: 25 mg/dL — ABNORMAL HIGH (ref 6–20)
CHLORIDE: 113 mmol/L — AB (ref 101–111)
CO2: 18 mmol/L — AB (ref 22–32)
CREATININE: 1.22 mg/dL (ref 0.61–1.24)
Calcium: 7.4 mg/dL — ABNORMAL LOW (ref 8.9–10.3)
GFR calc non Af Amer: 55 mL/min — ABNORMAL LOW (ref >60)
Glucose, Bld: 255 mg/dL — ABNORMAL HIGH (ref 65–99)
Potassium: 3 mmol/L — ABNORMAL LOW (ref 3.5–5.1)
Sodium: 139 mmol/L (ref 135–145)

## 2017-09-14 LAB — GLUCOSE, CAPILLARY
GLUCOSE-CAPILLARY: 71 mg/dL (ref 65–99)
GLUCOSE-CAPILLARY: 258 mg/dL — AB (ref 65–99)
GLUCOSE-CAPILLARY: 279 mg/dL — AB (ref 65–99)
Glucose-Capillary: 244 mg/dL — ABNORMAL HIGH (ref 65–99)
Glucose-Capillary: 319 mg/dL — ABNORMAL HIGH (ref 65–99)
Glucose-Capillary: 102 mg/dL — ABNORMAL HIGH (ref 65–99)

## 2017-09-14 LAB — LACTIC ACID, PLASMA: LACTIC ACID, VENOUS: 1.6 mmol/L (ref 0.5–1.9)

## 2017-09-14 LAB — POCT I-STAT 3, ART BLOOD GAS (G3+)
Acid-base deficit: 5 mmol/L — ABNORMAL HIGH (ref 0.0–2.0)
Bicarbonate: 19.4 mmol/L — ABNORMAL LOW (ref 20.0–28.0)
O2 SAT: 100 %
TCO2: 20 mmol/L — AB (ref 22–32)
pCO2 arterial: 31 mmHg — ABNORMAL LOW (ref 32.0–48.0)
pH, Arterial: 7.402 (ref 7.350–7.450)
pO2, Arterial: 163 mmHg — ABNORMAL HIGH (ref 83.0–108.0)

## 2017-09-14 LAB — PROCALCITONIN: Procalcitonin: 0.78 ng/mL

## 2017-09-14 LAB — TROPONIN I
Troponin I: 0.11 ng/mL (ref <0.03)
Troponin I: 0.03 ng/mL (ref <0.03)
Troponin I: 0.07 ng/mL (ref <0.03)

## 2017-09-14 LAB — LEVETIRACETAM LEVEL: LEVETIRACETAM: 20.8 ug/mL (ref 10.0–40.0)

## 2017-09-14 LAB — MAGNESIUM
Magnesium: 2 mg/dL (ref 1.7–2.4)
Magnesium: 2 mg/dL (ref 1.7–2.4)

## 2017-09-14 LAB — PHOSPHORUS
Phosphorus: 2.1 mg/dL — ABNORMAL LOW (ref 2.5–4.6)
Phosphorus: 2.4 mg/dL — ABNORMAL LOW (ref 2.5–4.6)

## 2017-09-14 LAB — PHENYTOIN LEVEL, FREE AND TOTAL
PHENYTOIN, TOTAL: 18.4 ug/mL (ref 10.0–20.0)
Phenytoin, Free: 3 ug/mL — ABNORMAL HIGH (ref 1.0–2.0)

## 2017-09-14 LAB — PHENYTOIN LEVEL, TOTAL: Phenytoin Lvl: 16.5 ug/mL (ref 10.0–20.0)

## 2017-09-14 MED ORDER — INSULIN GLARGINE 100 UNIT/ML ~~LOC~~ SOLN
10.0000 [IU] | Freq: Every day | SUBCUTANEOUS | Status: DC
  Administered 2017-09-14 – 2017-09-15 (×2): 10 [IU] via SUBCUTANEOUS
  Filled 2017-09-14 (×2): qty 0.1

## 2017-09-14 MED ORDER — LORAZEPAM 2 MG/ML IJ SOLN
2.0000 mg | Freq: Once | INTRAMUSCULAR | Status: AC
  Administered 2017-09-14: 1 mg via INTRAVENOUS
  Filled 2017-09-14: qty 1

## 2017-09-14 MED ORDER — PREGABALIN 75 MG PO CAPS
200.0000 mg | ORAL_CAPSULE | Freq: Once | ORAL | Status: AC
  Administered 2017-09-14: 200 mg via ORAL
  Filled 2017-09-14: qty 1

## 2017-09-14 MED ORDER — PREGABALIN 75 MG PO CAPS
150.0000 mg | ORAL_CAPSULE | Freq: Three times a day (TID) | ORAL | Status: DC
  Administered 2017-09-15 – 2017-09-16 (×4): 150 mg via ORAL
  Filled 2017-09-14 (×4): qty 2

## 2017-09-14 MED ORDER — POTASSIUM PHOSPHATES 15 MMOLE/5ML IV SOLN
20.0000 mmol | Freq: Once | INTRAVENOUS | Status: AC
  Administered 2017-09-14: 20 mmol via INTRAVENOUS
  Filled 2017-09-14: qty 6.67

## 2017-09-14 MED ORDER — SODIUM CHLORIDE 0.9 % IV SOLN
0.0000 ug/min | INTRAVENOUS | Status: DC
  Filled 2017-09-14: qty 1

## 2017-09-14 NOTE — Progress Notes (Signed)
Called and alerted Elink due to low BP.  Ordered to and administered bolus.

## 2017-09-14 NOTE — Progress Notes (Signed)
MEDICATION RELATED CONSULT NOTE - INITIAL   Pharmacy Consult for phenytoin Indication: seizures  Allergies  Allergen Reactions  . Coumadin [Warfarin Sodium] Palpitations    Heart races  . Penicillins Hives and Anaphylaxis    Patient Measurements: Height: 5\' 10"  (177.8 cm) Weight: 197 lb 8.5 oz (89.6 kg) IBW/kg (Calculated) : 73  Vital Signs: Temp: 99 F (37.2 C) (10/30 0300) Temp Source: Axillary (10/30 0300) BP: 115/81 (10/30 0730) Pulse Rate: 79 (10/30 0730) Intake/Output from previous day: 10/29 0701 - 10/30 0700 In: 3062.8 [I.V.:1150.7; NG/GT:1142.2; IV Piggyback:770] Out: 860 [Urine:860] Intake/Output from this shift: No intake/output data recorded.  Estimated Creatinine Clearance: 57.1 mL/min (by C-G formula based on SCr of 1.22 mg/dL).  Medications:  Keppra  500mg  IV Q12H Lacosamide 100mg  IV Q12H Phenytoin on hold  Assessment: [age] yom with seizures continues on anti-epileptic regimen noted above. Phenytoin level remains elevated at 16.5 which corrects to 24.4 due to low albumin. EEG with severe irritability on the left per neurology.   Goal of Therapy:  Total Phenytoin 10-20 Free Phenytoin 1-2  Plan:  Continue to hold phenytoin F/u AM level and albumin level  Alejandro [name], Drake Leachachel Lynn 09/14/2017,8:16 AM

## 2017-09-14 NOTE — Progress Notes (Signed)
[place] [facility] Note  Admission Date:  08/27/2017  Presenting HPI:  76 y.o. male brought in by his son after being found unresponsive in his home. Unknown down time. Known history of diabetes mellitus type 2, essential hypertension, and history of stroke with residual left-sided hemiparesis. Upon EMS arrival patient had grand mal seizure and was treated with Ativan. Patient had 2 subsequent seizures in route to the emergency department. Patient subsequently received fosphenytoin and Keppra to control his seizure. He was endotracheally intubated and started on a propofol infusion. Initial glucose was greater than 700.  Subjective: Overnight episode of bradycardia.  Required elevated FiO2 needs.  Currently nonresponsive to verbal stimulus.  Review of Systems:  Unable to obtain given altered mentation, intubation & sedation.   Vent Mode: PRVC FiO2 (%):  [40 %-70 %] 50 % Set Rate:  [15 bmp] 15 bmp Vt Set:  [580 mL] 580 mL PEEP:  [5 cmH20] 5 cmH20 Pressure Support:  [5 cmH20] 5 cmH20 Plateau Pressure:  [18 cmH20] 18 cmH20  Temp:  [97.3 F (36.3 C)-99.1 F (37.3 C)] 97.3 F (36.3 C) (10/30 0800) Pulse Rate:  [76-86] 77 (10/30 0800) Resp:  [12-24] 17 (10/30 0800) BP: (79-141)/(64-93) 117/75 (10/30 0800) SpO2:  [91 %-100 %] 100 % (10/30 0800) FiO2 (%):  [40 %-70 %] 50 % (10/30 0740) Weight:  [197 lb 8.5 oz (89.6 kg)] 197 lb 8.5 oz (89.6 kg) (10/30 0300)  General: Frail ill-appearing African-American male intubated on full vent support HEENT: Endotracheal tube noted, G-tube with tube feedings at goal.  No JVD or lymphadenopathy noted PSY: Not responsive to verbal stimulus Neuro: Pupils equal react positive gag reflex CV: Heart sounds are regular regular rate and rhythm PULM: Decreased breath sounds throughout UD:JSHF, non-tender, bsx4 active tube feeding at goal Extremities: warm/dry, negative edema  Skin: no rashes or lesions   LINES/TUBES: OETT  8.0 10/27 >>> R  RAD ART LINE 10/27 >>> OGT 10/27 >>> PIV  CBC Latest Ref Rng & Units 09/14/2017 09/13/2017 09/12/2017  WBC 4.0 - 10.5 K/uL 13.0(H) 14.6(H) 17.1(H)  Hemoglobin 13.0 - 17.0 g/dL 11.1(L) 11.1(L) 11.2(L)  Hematocrit 39.0 - 52.0 % 34.4(L) 34.1(L) 34.2(L)  Platelets 150 - 400 K/uL 144(L) 139(L) 153   BMP Latest Ref Rng & Units 09/14/2017 09/13/2017 09/12/2017  Glucose 65 - 99 mg/dL 255(H) 258(H) 184(H)  BUN 6 - 20 mg/dL 25(H) 20 20  Creatinine 0.61 - 1.24 mg/dL 1.22 1.27(H) 1.29(H)  Sodium 135 - 145 mmol/L 139 140 142  Potassium 3.5 - 5.1 mmol/L 3.0(L) 3.2(L) 3.5  Chloride 101 - 111 mmol/L 113(H) 111 114(H)  CO2 22 - 32 mmol/L 18(L) 22 22  Calcium 8.9 - 10.3 mg/dL 7.4(L) 7.9(L) 8.1(L)   CBG (last 3)   Recent Labs  09/13/17 2322 09/14/17 0330 09/14/17 0739  GLUCAP 246* 258* 279*    Hepatic Function Latest Ref Rng & Units 09/13/2017 09/13/2017 09/12/2017  Total Protein 6.5 - 8.1 g/dL - - -  Albumin 3.5 - 5.0 g/dL 2.1(L) 2.0(L) 2.1(L)  AST 15 - 41 U/L - - -  ALT 17 - 63 U/L - - -  Alk Phosphatase 38 - 126 U/L - - -  Total Bilirubin 0.3 - 1.2 mg/dL - - -    IMAGING/STUDIES: CT HEAD W/O 10/27:  Atrophy and significant small vessel disease.  No evidence for acute  abnormality. PORT CXR 10/28:  Personally reviewed by me. Radiology reported that endotracheal tube was at the level of the carina  nearly entering right main stem. Endotracheal tube appears to be appropriately positioned by my review. No focal opacity. No pleural effusion. Enteric feeding tube coursing below diaphragm.   MICROBIOLOGY: MRSA PCR 10/27:  Negative Blood Cultures x2 10/27 >>> Urine Culture 10/27:  Negative   ANTIBIOTICS: Aztreonam 10/27 >>> Vancomycin 10/27 >>>  SIGNIFICANT EVENTS: 10/27 - Admit w/ Grand Mal seizure  10/28 - Hypotensive, bolused with 1L IVF 10/29 - copious creamy ETT secretions  ASSESSMENT/PLAN:  76 y.o. male admitted with acute encephalopathy secondary to multiple potential  etiologies. Admitted with grand mal seizures, hyperglycemia with questionable DKA versus hyperosmolar state, acute renal failure, and very brittle glucose control. 10/30 episode of bradycardia and was briefly on pressors. Required increased FIO2.  -Vent dependent respiratory failure in the setting of bilateral cerebral infarcts, combined metabolic and respiratory acidosis.  No gap remains at 8 Continue current FiO2 and wean as tolerated. Not weanable.  -Acute encephalopathy multifactorial in nature stroke noted on MRI per neurology  -Seizures per neurology  -Hyperglycemia in the setting of diabetes.  Lantus added to pharmaceutical regimen 10/30  -Electrolyte imbalance corrected 10/30 follow-up labs scheduled.  Repeat lactic acid is been scheduled.  -Septic shock secondary to suspected urinary tract infection.  Day 3 of vancomycin and Azactam.  No positive culture data at this time  -CODE STATUS he remains a full code at this time    Prophylaxis:  Protonix via tube daily & SCDs. Starting Heparin [place] q8hr.  Diet:  NPO. Starting tube feeds  Code Status:  Full Code pre previous physician discussion. Disposition:  Remains critically ill in the ICU. Social Worker consulted on admission.  Family Update: 10/30 no family at bedside App CCT 40 min  Richardson Landry Tawsha Terrero ACNP Maryanna Shape PCCM Pager [phone] till 1 pm If no answer page [phone] 09/14/2017, 9:03 AM

## 2017-09-14 NOTE — Care Management Note (Signed)
Case Management Note  Patient Details  Name: Alejandro FuseChristopher [name] MRN: [id number] Date of Birth: January 24, 1941  Subjective/Objective:  Pt admitted on 08/19/2017 after being found down by his son at home.   Pt admitted with status epilepticus probably secondary to non-ketotic hyperosmolar coma.  PTA, pt resided at home with family members. (Daughter and wife, who is bedbound).               Action/Plan: Pt currently remains on full ventilator support.  Will follow as pt progresses.  Expected Discharge Date:                  Expected Discharge Plan:     In-House Referral:     Discharge planning Services  CM Consult  Post Acute Care Choice:    Choice offered to:     DME Arranged:    DME Agency:     HH Arranged:    HH Agency:     Status of Service:  In process, will continue to follow  If discussed at Long Length of Stay Meetings, dates discussed:    Additional Comments:  Quintella BatonJulie W. Evangelene Vora, RN, BSN  Trauma/Neuro ICU Case Manager [phone]

## 2017-09-14 NOTE — Progress Notes (Signed)
[facility] called in regards to the pt's grandson who is in the [employer] ([place] They would like to know the prognosis and a few other things. As the EEG results play a role in his prognosis, they would like to be called back at [phone] (Case # [id number]).

## 2017-09-14 NOTE — Progress Notes (Signed)
Neurology Progress Note   S:// Patient seen and examined Per chart review, overnight episode of bradycardia and increased oxygen requirements. Daughter at bedside  O:// Current vital signs: BP 117/75   Pulse 77   Temp (!) 97.3 F (36.3 C) (Axillary)   Resp 17   Ht '5\' 10"'$  (1.778 m)   Wt 89.6 kg (197 lb 8.5 oz)   SpO2 100%   BMI 28.34 kg/m  Vital signs in last 24 hours: Temp:  [97.3 F (36.3 C)-99.1 F (37.3 C)] 97.3 F (36.3 C) (10/30 0800) Pulse Rate:  [76-86] 77 (10/30 0800) Resp:  [12-24] 17 (10/30 0800) BP: (79-141)/(64-93) 117/75 (10/30 0800) SpO2:  [91 %-100 %] 100 % (10/30 0800) FiO2 (%):  [40 %-70 %] 50 % (10/30 0740) Weight:  [89.6 kg (197 lb 8.5 oz)] 89.6 kg (197 lb 8.5 oz) (10/30 0300) General: Ill-appearing elderly man, currently intubated, in no apparent distress HEENT: Normocephalic, atraumatic Respiratory: Nonlabored breathing, no rales or wheezes Extremities: Trace edema Neurological exam Mental status: Patient is not sedated, intubated, does not open eyes to voice or noxious stimulus.  Does not follow any commands Speech cannot be assessed for the above said reason Cranial nerve exam: Pupils are equal round reactive to light, gaze is slightly disconjugate with right exotropia, intact corneals, intact cough and gag actively biting on the tube, difficult to ascertain facial symmetry because of endotracheal tube in place Motor exam: No spontaneous movements noted.  No withdrawal to noxious stim on upper extremities.  Some withdrawal to noxious edematous on left lower extremity.  Triple flexion on right lower extremity. Sensory exam as above Coordination and gait cannot be tested  Medications  Current Facility-Administered Medications:  .  0.9 %  sodium chloride infusion, , Intravenous, Continuous PRN, Davonna Belling, MD, Stopped at 08/27/2017 1358 .  0.9 %  sodium chloride infusion, 250 mL, Intravenous, PRN, Crim, Courtney, MD .  0.9 %  sodium chloride  infusion, , Intravenous, Continuous, Jamesetta So, MD, Stopped at 08/26/2017 2128 .  Place/Maintain arterial line, , , Until Discontinued **AND** 0.9 %  sodium chloride infusion, , Intra-arterial, PRN, Zaaqoq, Elveria Rising, MD .  atorvastatin (LIPITOR) tablet 40 mg, 40 mg, Per Tube, q1800, Javier Glazier, MD, 40 mg at 09/13/17 1843 .  aztreonam (AZACTAM) 1 g in dextrose 5 % 50 mL IVPB, 1 g, Intravenous, Q8H, Jamesetta So, MD, Stopped at 09/14/17 [phone]  chlorhexidine gluconate (MEDLINE KIT) (PERIDEX) 0.12 % solution 15 mL, 15 mL, Mouth Rinse, BID, Crim, Courtney, MD, 15 mL at 09/14/17 0719 .  clopidogrel (PLAVIX) tablet 75 mg, 75 mg, Per Tube, Daily, Javier Glazier, MD, 75 mg at 09/13/17 1000 .  dextrose 5 % in lactated ringers infusion, , Intravenous, Continuous, Hammonds, Sharyn Blitz, MD, Last Rate: 20 mL/hr at 09/14/17 0318 .  feeding supplement (VITAL AF 1.2 CAL) liquid 1,000 mL, 1,000 mL, Per Tube, Continuous, Javier Glazier, MD, Last Rate: 70 mL/hr at 09/14/17 0320, 1,000 mL at 09/14/17 0320 .  fentaNYL (SUBLIMAZE) injection 25-50 mcg, 25-50 mcg, Intravenous, Q1H PRN, Javier Glazier, MD, 50 mcg at 09/13/17 0245 .  heparin injection 5,000 Units, 5,000 Units, Subcutaneous, Q8H, Javier Glazier, MD, 5,000 Units at 09/14/17 [phone]  insulin aspart (novoLOG) injection 0-15 Units, 0-15 Units, Subcutaneous, Q4H, Anders Simmonds, MD, 8 Units at 09/14/17 [phone] .  insulin glargine (LANTUS) injection 10 Units, 10 Units, Subcutaneous, Daily, Minor, Grace Bushy, NP .  lacosamide (VIMPAT) 100 mg in sodium chloride  0.9 % 25 mL IVPB, 100 mg, Intravenous, Q12H, Kirkpatrick, Vida Roller, MD .  levETIRAcetam (KEPPRA) 500 mg in sodium chloride 0.9 % 100 mL IVPB, 500 mg, Intravenous, Q12H, Greta Doom, MD, Stopped at 09/14/17 0645 .  LORazepam (ATIVAN) injection 1-2 mg, 1-2 mg, Intravenous, Q15 min PRN, Javier Glazier, MD .  MEDLINE mouth rinse, 15 mL, Mouth Rinse, 10 times per day, Crim,  Courtney, MD, 15 mL at 09/14/17 0600 .  pantoprazole sodium (PROTONIX) 40 mg/20 mL oral suspension 40 mg, 40 mg, Per Tube, Daily, Crim, Courtney, MD, 40 mg at 09/13/17 1000 .  phenylephrine (NEO-SYNEPHRINE) 10 mg in sodium chloride 0.9 % 250 mL (0.04 mg/mL) infusion, 0-400 mcg/min, Intravenous, Titrated, Anders Simmonds, MD .  potassium PHOSPHATE 20 mmol in dextrose 5 % 500 mL infusion, 20 mmol, Intravenous, Once, Minor, Grace Bushy, NP .  propofol (DIPRIVAN) 1000 MG/100ML infusion, 5-80 mcg/kg/min, Intravenous, Titrated, Greta Doom, MD, Stopped at 09/12/17 0805 .  vancomycin (VANCOCIN) IVPB 750 mg/150 ml premix, 750 mg, Intravenous, Q12H, Otilio Miu, [facility], Last Rate: 150 mL/hr at 09/14/17 0723, 750 mg at 09/14/17 0723 Labs CBC    Component Value Date/Time   WBC 13.0 (H) 09/14/2017 0507   RBC 4.41 09/14/2017 0507   HGB 11.1 (L) 09/14/2017 0507   HCT 34.4 (L) 09/14/2017 0507   PLT 144 (L) 09/14/2017 0507   MCV 78.0 09/14/2017 0507   MCH 25.2 (L) 09/14/2017 0507   MCHC 32.3 09/14/2017 0507   RDW 15.0 09/14/2017 0507   LYMPHSABS 1.3 09/13/2017 0518   MONOABS 1.6 (H) 09/13/2017 0518   EOSABS 0.0 09/13/2017 0518   BASOSABS 0.0 09/13/2017 0518    CMP     Component Value Date/Time   NA 139 09/14/2017 0507   K 3.0 (L) 09/14/2017 0507   CL 113 (H) 09/14/2017 0507   CO2 18 (L) 09/14/2017 0507   GLUCOSE 255 (H) 09/14/2017 0507   BUN 25 (H) 09/14/2017 0507   CREATININE 1.22 09/14/2017 0507   CREATININE 1.33 (H) 09/21/2015 1333   CALCIUM 7.4 (L) 09/14/2017 0507   PROT 8.7 (H) 09/05/2017 1129   ALBUMIN 2.1 (L) 09/13/2017 1205   AST 31 09/10/2017 1129   ALT 16 (L) 08/28/2017 1129   ALKPHOS 87 08/25/2017 1129   BILITOT 0.8 09/09/2017 1129   GFRNONAA 55 (L) 09/14/2017 0507   GFRAA >60 09/14/2017 0507   Lipid Panel     Component Value Date/Time   CHOL 140 09/13/2017 0518   TRIG 69 09/13/2017 0518   HDL 43 09/13/2017 0518   CHOLHDL 3.3 09/13/2017 0518   VLDL 14  09/13/2017 0518   LDLCALC 83 09/13/2017 0518     Imaging I have reviewed images in epic and the results pertinent to this consultation are: MRI of the brain done on 09/12/2017 shows acute infarcts in both cerebral hemispheres.  Right temporoparietal cortex and white matter as well as left lateral periventricular areas of infarct.  No evidence of bleed.  Advanced chronic small vessel disease.  Stroke workup Carotid Dopplers showed no evidence of significant stenosis in bilateral internal carotids.  Flow in the verts is antegrade.  Echocardiogram shows left ventricular ejection fraction 55-60%.  No regional wall motion abnormalities.  No PFO on TTE.  Trivial aortic regurgitation.  Left atrium normal in size.  LDL 83 A1c 14.4  Assessment: 76 year old man who presented with focal status epilepticus in the setting of severe hyperglycemia and intubated for airway protection after his seizures  did not respond to Ativan and Versed, requiring propofol to abort clinical seizures. Initial EEG suggestive of seizures in the early part of EEG tracings which resolved after Dilantin load. Burst suppression pattern was never pursued and he remained on low-dose sedation. Following cessation of sedation, his neurological exam is poor. Given his history of baseline severe dementia as well as hyperglycemia and uncontrolled diabetes and other risk factors, he could have a prolonged postictal phase as well as prolonged recovery from sedation but given the fact that he did have seizures at presentation and a repeat EEG done also shows some irritability in the left cerebral hemisphere, he is currently being hooked up to long-term EEG monitoring.  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  Phenytoin supratherapeutic and being held.  Pharmacy on board for phenytoin dosing.  Appreciate pharmacy assistance. -Management of toxic metabolic derangements per PCCM including UTI, electrolyte derangements and hyperglycemia.  Acute ischemic strokes - likely cardioembolic -Continue Plavix and statin for secondary prevention -No significant vascular occlusion on carotid Dopplers -Severely deranged hemoglobin A1c indicating poorly controlled diabetes.  Would recommend strict control with goal A1c less than 7. -Hyperlipidemia.  Goal LDL less than 70 -Given his advanced dementia and discussions with family about not wanting to pursue aggressive treatment, I do not think we need to pursue a TEE at this time.  Neurology will continue to follow with you  -- Amie Portland, MD [facility] [phone] If 7pm to 7am, please call on call as listed on AMION.

## 2017-09-14 NOTE — Progress Notes (Signed)
Pt had episode of bradycardia  down to the 40's and hypotension down to around a MAP of 41.  BP was rechecked twice and a manual was taken to confirm.  CCM notified and an order of NEO has been placed in case his BP drops again. Will continue to monitor.

## 2017-09-14 NOTE — Progress Notes (Signed)
LTM running - will notify Dr Dorthea CoveSass

## 2017-09-14 NOTE — Progress Notes (Signed)
While turning pt, noted bile coming out of his mouth. Abd still distended as this morning, but very firm. Tube feed stopped. MD notified. [facility] and [facility].   Also- [facility] called back requesting an update. I explained there were no new updates from earlier today. They continued to request prognosis and I responded the EEG results will not be in until tomorrow. All questions were answered per repeated requests.  I explained there will be likely no changes in the plan or prognosis today. I stated that if they were concerned for time frame issues they can just send request to have grandson deployed to hospital.

## 2017-09-14 NOTE — Progress Notes (Signed)
eLink Physician-Brief Progress Note Patient Name: Alejandro FuseChristopher [name] DOB: [birth year] MRN: [id number]   Date of Service  09/14/2017  HPI/Events of Note  Episode of hypotension and bradycardia - BP now = 138/68 by A-line. 12 Lead EKG earlier in the evening read as pericarditis.   eICU Interventions  Will order: 1. Phenylephrine IV infusion. Titrate to MAP > 65. 2. Cycle troponin.     Intervention Category Major Interventions: Hypotension - evaluation and management  [name] 09/14/2017, 5:06 AM

## 2017-09-15 ENCOUNTER — Encounter (HOSPITAL_COMMUNITY): Payer: Self-pay

## 2017-09-15 ENCOUNTER — Inpatient Hospital Stay (HOSPITAL_COMMUNITY): Payer: Medicare Other

## 2017-09-15 LAB — CULTURE, RESPIRATORY W GRAM STAIN

## 2017-09-15 LAB — CBC
HCT: 33.8 % — ABNORMAL LOW (ref 39.0–52.0)
HEMOGLOBIN: 10.9 g/dL — AB (ref 13.0–17.0)
MCH: 25 pg — AB (ref 26.0–34.0)
MCHC: 32.2 g/dL (ref 30.0–36.0)
MCV: 77.5 fL — AB (ref 78.0–100.0)
Platelets: 176 10*3/uL (ref 150–400)
RBC: 4.36 MIL/uL (ref 4.22–5.81)
RDW: 15.5 % (ref 11.5–15.5)
WBC: 18.1 10*3/uL — ABNORMAL HIGH (ref 4.0–10.5)

## 2017-09-15 LAB — BLOOD GAS, ARTERIAL
Acid-base deficit: 3.7 mmol/L — ABNORMAL HIGH (ref 0.0–2.0)
Bicarbonate: 20.6 mmol/L (ref 20.0–28.0)
Drawn by: 270271
FIO2: 30
LHR: 15 {breaths}/min
MECHVT: 580 mL
O2 SAT: 97.7 %
PATIENT TEMPERATURE: 98.7
PEEP: 5 cmH2O
PH ART: 7.378 (ref 7.350–7.450)
pCO2 arterial: 35.8 mmHg (ref 32.0–48.0)
pO2, Arterial: 101 mmHg (ref 83.0–108.0)

## 2017-09-15 LAB — BASIC METABOLIC PANEL
ANION GAP: 9 (ref 5–15)
BUN: 24 mg/dL — ABNORMAL HIGH (ref 6–20)
CALCIUM: 7.5 mg/dL — AB (ref 8.9–10.3)
CHLORIDE: 112 mmol/L — AB (ref 101–111)
CO2: 19 mmol/L — AB (ref 22–32)
Creatinine, Ser: 1.29 mg/dL — ABNORMAL HIGH (ref 0.61–1.24)
GFR calc non Af Amer: 52 mL/min — ABNORMAL LOW (ref >60)
Glucose, Bld: 125 mg/dL — ABNORMAL HIGH (ref 65–99)
Potassium: 3.1 mmol/L — ABNORMAL LOW (ref 3.5–5.1)
Sodium: 140 mmol/L (ref 135–145)

## 2017-09-15 LAB — GLUCOSE, CAPILLARY
GLUCOSE-CAPILLARY: 184 mg/dL — AB (ref 65–99)
GLUCOSE-CAPILLARY: 93 mg/dL (ref 65–99)
GLUCOSE-CAPILLARY: 145 mg/dL — AB (ref 65–99)
Glucose-Capillary: 118 mg/dL — ABNORMAL HIGH (ref 65–99)
Glucose-Capillary: 130 mg/dL — ABNORMAL HIGH (ref 65–99)
Glucose-Capillary: 135 mg/dL — ABNORMAL HIGH (ref 65–99)
Glucose-Capillary: 57 mg/dL — ABNORMAL LOW (ref 65–99)
Glucose-Capillary: 75 mg/dL (ref 65–99)

## 2017-09-15 LAB — CULTURE, RESPIRATORY

## 2017-09-15 LAB — TRIGLYCERIDES: TRIGLYCERIDES: 71 mg/dL (ref <150)

## 2017-09-15 LAB — ALBUMIN: Albumin: 1.5 g/dL — ABNORMAL LOW (ref 3.5–5.0)

## 2017-09-15 LAB — PHENYTOIN LEVEL, TOTAL: PHENYTOIN LVL: 12.9 ug/mL (ref 10.0–20.0)

## 2017-09-15 MED ORDER — LORAZEPAM 2 MG/ML IJ SOLN
INTRAMUSCULAR | Status: AC
  Filled 2017-09-15: qty 1

## 2017-09-15 MED ORDER — SODIUM CHLORIDE 0.9 % IV SOLN: 250.0000 mL | INTRAVENOUS | Status: DC | PRN

## 2017-09-15 MED ORDER — LORAZEPAM 2 MG/ML IJ SOLN
1.0000 mg | Freq: Once | INTRAMUSCULAR | Status: AC
  Administered 2017-09-15: 1 mg via INTRAVENOUS

## 2017-09-15 MED ORDER — POTASSIUM CHLORIDE 10 MEQ/100ML IV SOLN
10.0000 meq | INTRAVENOUS | Status: AC
  Administered 2017-09-15 (×4): 10 meq via INTRAVENOUS
  Filled 2017-09-15 (×4): qty 100

## 2017-09-15 MED ORDER — PROPOFOL 1000 MG/100ML IV EMUL
50.0000 ug/kg/min | INTRAVENOUS | Status: DC
  Administered 2017-09-15: 30 ug/kg/min via INTRAVENOUS
  Administered 2017-09-15 (×2): 50 ug/kg/min via INTRAVENOUS
  Administered 2017-09-15: 30 ug/kg/min via INTRAVENOUS
  Administered 2017-09-16: 50 ug/kg/min via INTRAVENOUS
  Filled 2017-09-15 (×5): qty 100

## 2017-09-15 MED ORDER — PROPOFOL 1000 MG/100ML IV EMUL
INTRAVENOUS | Status: AC
  Administered 2017-09-15: 60 mL
  Filled 2017-09-15: qty 100

## 2017-09-15 MED ORDER — SODIUM CHLORIDE 0.9 % IV SOLN
0.0000 ug/min | INTRAVENOUS | Status: DC
  Administered 2017-09-15: 20 ug/min via INTRAVENOUS
  Administered 2017-09-15: 100 ug/min via INTRAVENOUS
  Administered 2017-09-15: 50 ug/min via INTRAVENOUS
  Administered 2017-09-15: 20 ug/min via INTRAVENOUS
  Administered 2017-09-16 (×2): 70 ug/min via INTRAVENOUS
  Filled 2017-09-15 (×13): qty 1

## 2017-09-15 MED ORDER — DEXTROSE 50 % IV SOLN
INTRAVENOUS | Status: AC
  Administered 2017-09-15: 25 mL
  Filled 2017-09-15: qty 50

## 2017-09-15 NOTE — Procedures (Signed)
LTM-EEG Report  HISTORY: Continuous video-EEG monitoring performed for 76 year old with encephalopathy, possible seizures.  ACQUISITION: International 10-20 system for electrode placement; 18 channels with additional eyes linked to ipsilateral ears and EKG. Additional T1-T2 electrodes were used. Continuous video recording obtained.   EEG NUMBER:  MEDICATIONS:  Day 1: LEV, LCM  DAY #1: from 1022 09/14/17 to 0730 09/15/17  BACKGROUND: An overall medium voltage, mostly continuous recording with some spontaneous variability and reactivity. The background consisted of medium voltage delta-theta activity bilaterally with sparse superimposed faster frequencies (often increased in the left anterior regions) and no clear evidence of a posterior basic rhythm. State changes were captured but without apparent sleep architecture. Some periods of attenuation were seen with increased sedation.  EPILEPTIFORM/PERIODIC ACTIVITY: There were frequent left anterior sharp discharges, often in a periodic pattern (LPDs), frequency 1hz  without ictal evolution or clinical changes. These were stable throughout the recording.  SEIZURES: none EVENTS: There were no reported clinical events  EKG: no significant arrhythmia  SUMMARY: This was an abnormal continuous video EEG due to diffuse slow activity with some left anteiror periodic discharges suggestive of an encephalopathy pattern with superimposed focal disturbance on the left with epileptogenic potential. Though LPDs were present on the left, there was no ictal evolution to the pattern and no clear clinical accompaniment.

## 2017-09-15 NOTE — Progress Notes (Signed)
Neurology Progress Note   S:// Overnight EEG was reviewed by Dr. Leonel Ramsay.  No change in the EEG but persistent epileptogenic focus in the left cerebral hemisphere. He was started on propofol drip overnight again but he developed hypotension, requiring pressors. Pregabalin was added to his regimen of antiepileptics at this time. His daughter and wife are at the bedside.  I explained to them in depth his clinical condition.  Please see my discussion below. Patient continues to be in LTM EEG  O:// Current vital signs: BP 110/76   Pulse 77   Temp 97.9 F (36.6 C) (Axillary)   Resp 16   Ht '5\' 10"'$  (1.778 m)   Wt 90.4 kg (199 lb 4.7 oz)   SpO2 97%   BMI 28.60 kg/m  Vital signs in last 24 hours: Temp:  [97.9 F (36.6 C)-100.1 F (37.8 C)] 97.9 F (36.6 C) (10/31 1153) Pulse Rate:  [61-99] 77 (10/31 1400) Resp:  [15-27] 16 (10/31 1400) BP: (62-147)/(47-95) 110/76 (10/31 1400) SpO2:  [9 %-100 %] 97 % (10/31 1400) FiO2 (%):  [30 %] 30 % (10/31 1107) Weight:  [90.4 kg (199 lb 4.7 oz)] 90.4 kg (199 lb 4.7 oz) (10/31 0453) Neurological exam Mental status: Patient is not sedated, intubated, does not open eyes to voice or noxious stimulus.  Does not follow any commands Speech cannot be assessed for the above said reason Cranial nerve exam: Pupils are equal round reactive to light, gaze is slightly disconjugate with right exotropia, intact corneals, intact cough and gag actively biting on the tube, difficult to ascertain facial symmetry because of endotracheal tube in place Motor exam: No spontaneous movements noted.  No withdrawal to noxious stim on upper extremities.  Some withdrawal to noxious edematous on left lower extremity.  Triple flexion on right lower extremity. Sensory exam as above Coordination and gait cannot be tested Medications  Current Facility-Administered Medications:  .  Place/Maintain arterial line, , , Until Discontinued **AND** 0.9 %  sodium chloride infusion, ,  Intra-arterial, PRN, Zaaqoq, Akram M, MD .  0.9 %  sodium chloride infusion, 250 mL, Intravenous, PRN, Deterding, Guadelupe Sabin, MD .  atorvastatin (LIPITOR) tablet 40 mg, 40 mg, Per Tube, q1800, Javier Glazier, MD, 40 mg at 09/13/17 1843 .  aztreonam (AZACTAM) 1 g in dextrose 5 % 50 mL IVPB, 1 g, Intravenous, Q8H, Crim, Courtney, MD, Stopped at 09/15/17 1348 .  chlorhexidine gluconate (MEDLINE KIT) (PERIDEX) 0.12 % solution 15 mL, 15 mL, Mouth Rinse, BID, Crim, Courtney, MD, 15 mL at 09/15/17 0746 .  clopidogrel (PLAVIX) tablet 75 mg, 75 mg, Per Tube, Daily, Javier Glazier, MD, 75 mg at 09/15/17 0944 .  feeding supplement (VITAL AF 1.2 CAL) liquid 1,000 mL, 1,000 mL, Per Tube, Continuous, Ashok Cordia Sonia Baller, MD, Stopped at 09/14/17 1715 .  fentaNYL (SUBLIMAZE) injection 25-50 mcg, 25-50 mcg, Intravenous, Q1H PRN, Javier Glazier, MD, 50 mcg at 09/14/17 1731 .  heparin injection 5,000 Units, 5,000 Units, Subcutaneous, Q8H, Javier Glazier, MD, 5,000 Units at 09/15/17 1318 .  insulin aspart (novoLOG) injection 0-15 Units, 0-15 Units, Subcutaneous, Q4H, Anders Simmonds, MD, 2 Units at 09/15/17 1156 .  lacosamide (VIMPAT) 100 mg in sodium chloride 0.9 % 25 mL IVPB, 100 mg, Intravenous, Q12H, Greta Doom, MD, Stopped at 09/15/17 1015 .  levETIRAcetam (KEPPRA) 500 mg in sodium chloride 0.9 % 100 mL IVPB, 500 mg, Intravenous, Q12H, Greta Doom, MD, Last Rate: 420 mL/hr at 09/15/17 0639, 500 mg at  09/15/17 5998 .  MEDLINE mouth rinse, 15 mL, Mouth Rinse, 10 times per day, Crim, Courtney, MD, 15 mL at 09/15/17 1316 .  pantoprazole sodium (PROTONIX) 40 mg/20 mL oral suspension 40 mg, 40 mg, Per Tube, Daily, Crim, Courtney, MD, 40 mg at 09/15/17 0944 .  phenylephrine (NEO-SYNEPHRINE) 10 mg in sodium chloride 0.9 % 250 mL (0.04 mg/mL) infusion, 0-400 mcg/min, Intravenous, Titrated, Deterding, Dorise Hiss, MD, Last Rate: 60 mL/hr at 09/15/17 1400, 40 mcg/min at 09/15/17 1400 .   potassium chloride 10 mEq in 100 mL IVPB, 10 mEq, Intravenous, Q1 Hr x 4, Minor, Vilinda Blanks, NP, Stopped at 09/15/17 1359 .  pregabalin (LYRICA) capsule 150 mg, 150 mg, Oral, TID, Rejeana Brock, MD, 150 mg at 09/15/17 0945 .  propofol (DIPRIVAN) 1000 MG/100ML infusion, 50 mcg/kg/min, Intravenous, Titrated, Rejeana Brock, MD, Last Rate: 26.9 mL/hr at 09/15/17 1400, 50 mcg/kg/min at 09/15/17 1400 .  vancomycin (VANCOCIN) IVPB 750 mg/150 ml premix, 750 mg, Intravenous, Q12H, Emi Holes, [place], Stopped at 09/15/17 5800 Labs CBC    Component Value Date/Time   WBC 18.1 (H) 09/15/2017 0454   RBC 4.36 09/15/2017 0454   HGB 10.9 (L) 09/15/2017 0454   HCT 33.8 (L) 09/15/2017 0454   PLT 176 09/15/2017 0454   MCV 77.5 (L) 09/15/2017 0454   MCH 25.0 (L) 09/15/2017 0454   MCHC 32.2 09/15/2017 0454   RDW 15.5 09/15/2017 0454   LYMPHSABS 1.3 09/13/2017 0518   MONOABS 1.6 (H) 09/13/2017 0518   EOSABS 0.0 09/13/2017 0518   BASOSABS 0.0 09/13/2017 0518    CMP     Component Value Date/Time   NA 140 09/15/2017 0454   K 3.1 (L) 09/15/2017 0454   CL 112 (H) 09/15/2017 0454   CO2 19 (L) 09/15/2017 0454   GLUCOSE 125 (H) 09/15/2017 0454   BUN 24 (H) 09/15/2017 0454   CREATININE 1.29 (H) 09/15/2017 0454   CREATININE 1.33 (H) 09/21/2015 1333   CALCIUM 7.5 (L) 09/15/2017 0454   PROT 8.7 (H) 09/06/2017 1129   ALBUMIN 1.5 (L) 09/15/2017 0454   AST 31 08/17/2017 1129   ALT 16 (L) 08/28/2017 1129   ALKPHOS 87 08/28/2017 1129   BILITOT 0.8 08/26/2017 1129   GFRNONAA 52 (L) 09/15/2017 0454   GFRAA >60 09/15/2017 0454    glycosylated hemoglobin  Lipid Panel     Component Value Date/Time   CHOL 140 09/13/2017 0518   TRIG 71 09/15/2017 0250   HDL 43 09/13/2017 0518   CHOLHDL 3.3 09/13/2017 0518   VLDL 14 09/13/2017 0518   LDLCALC 83 09/13/2017 0518     Imaging I have reviewed images in epic and the results pertinent to this consultation are: MRI of the brain done on  09/12/2017 shows acute infarcts in both cerebral hemispheres.  Right temporoparietal cortex and white matter as well as left lateral periventricular areas of infarct.  No evidence of bleed.  Advanced chronic small vessel disease.  Stroke workup Carotid Dopplers showed no evidence of significant stenosis in bilateral internal carotids.  Flow in the verts is antegrade.  Echocardiogram shows left ventricular ejection fraction 55-60%.  No regional wall motion abnormalities.  No PFO on TTE.  Trivial aortic regurgitation.  Left atrium normal in size.  LDL 83 A1c 14.4  Long-term overnight EEG from yesterday till this AM. Official report SUMMARY: This was an abnormal continuous video EEG due to diffuse slow activity with some left anteiror periodic discharges suggestive of an encephalopathy pattern with superimposed  focal disturbance on the left with epileptogenic potential. Though LPDs were present on the left, there was no ictal evolution to the pattern and no clear clinical accompaniment.   Assessment: 76 year old man who presented with focal status epilepticus in the setting of severe hyperglycemia, intubated for airway protection after his seizures did not respond to benzodiazepines, requiring propofol to abort clinical seizures. Initial EEG was suggestive of seizures in the early part of the EEG recordings which was resolved after Dilantin load.  With a burst suppression pattern was never pursued and he remained on low-dose sedation.  Following cessation of sedation, his neurological exam remained poor and he is currently still comatose. Long-term EEG was again initiated to look for any evidence of underlying nonconvulsive status epilepticus.  Although it did not reveal a nonconvulsive status epilepticus picture there was an area of irritability, potentially epileptogenic in the left cerebral hemisphere where his strokes are. MRI imaging revealed embolic-looking stroke in both the right and left  cerebral hemispheres. Last night, pregabalin was added to his antiepileptic regimen along with putting him back on propofol, which complicated his clinical course by inducing hypertension requiring pressors to maintain his blood pressures. Official read of the long-term EEG from overnight is pasted above. I do not think that the electrographic abnormality seen on EEG alone not responsible for his current clinical picture.  He is someone with advanced dementia, poorly controlled diabetes and other cerebrovascular and cardiovascular risk factors, who at this point has multifactorial toxic metabolic encephalopathy, which I think will have a poor prognosis of any neurologically meaningful recovery going forward.  Impression: Cardio embolic strokes Status epilepticus-resolved with residual epileptogenic irritability in the left cerebral hemisphere. Multifactorial toxic metabolic encephalopathy Advanced dementia  Recommendations: Continue Plavix and statin for secondary prevention of strokes. Continue with Keppra and Vimpat.  Phenytoin was supra therapeutic and being held.  Pharmacy on board for dosing of phenytoin. Continue with pregabalin that was added overnight I had a detailed discussion with the daughter and the wife at the patient's bedside.  They are very understanding that the patient's neurological outcome is going to be grim.  Rest of the family including patient's son are on their way from [place].  I am going to have a detailed conversation with them in the later part of the day.  The family seems to be on board to pursue compassionate extubation followed by comfort measures only. I will communicate the details of my meeting to the primary team.  Amie Portland, MD [facility] [phone] If 7pm to 7am, please call on call as listed on AMION.

## 2017-09-15 NOTE — Progress Notes (Signed)
[facility] Note  Admission Date:  08/16/2017  Presenting HPI:  76 y.o. male brought in by his son after being found unresponsive in his home. Unknown down time. Known history of diabetes mellitus type 2, essential hypertension, and history of stroke with residual left-sided hemiparesis. Upon EMS arrival patient had grand mal seizure and was treated with Ativan. Patient had 2 subsequent seizures in route to the emergency department. Patient subsequently received fosphenytoin and Keppra to control his seizure. He was endotracheally intubated and started on a propofol infusion. Initial glucose was greater than 700.  Subjective: Patient is started on propofol for seizure activity per neurology.  Required Neo-Synephrine drip due to hypotension from propofol.  Poorly responsive at this time.  Review of Systems:  Unable to obtain given altered mentation, intubation & sedation.   Vent Mode: PRVC FiO2 (%):  [30 %] 30 % Set Rate:  [15 bmp] 15 bmp Vt Set:  [580 mL] 580 mL PEEP:  [5 cmH20] 5 cmH20 Plateau Pressure:  [18 cmH20-20 cmH20] 18 cmH20  Temp:  [98.4 F (36.9 C)-100.1 F (37.8 C)] 98.7 F (37.1 C) (10/31 0800) Pulse Rate:  [61-99] 62 (10/31 0900) Resp:  [15-28] 15 (10/31 0900) BP: (62-147)/(47-95) 128/83 (10/31 0900) SpO2:  [9 %-100 %] 99 % (10/31 0900) FiO2 (%):  [30 %] 30 % (10/31 0737) Weight:  [199 lb 4.7 oz (90.4 kg)] 199 lb 4.7 oz (90.4 kg) (10/31 0453)  General: Ill-appearing male.  Remains intubated.  Currently on propofol drip for burst suppression HEENT: MM pink/moist endotracheal tube connected to ventilator.  OG tube now connected to low wall suction due to episode of vomiting during the night of 1030 PSY: Not applicable Neuro: Withdraws to noxious stimuli CV: Heart sounds are regular regular rate and rhythm PULM: Diminished bilaterally GI: Abdomen is distended decreased bowel sounds.  G-tube is noticed low also Extremities: warm/dry, negative edema   Skin: no rashes or lesions ns   LINES/TUBES: OETT  8.0 10/27 >>> R RAD ART LINE 10/27 >>> OGT 10/27 >>> PIV  CBC Latest Ref Rng & Units 09/15/2017 09/14/2017 09/13/2017  WBC 4.0 - 10.5 K/uL 18.1(H) 13.0(H) 14.6(H)  Hemoglobin 13.0 - 17.0 g/dL 10.9(L) 11.1(L) 11.1(L)  Hematocrit 39.0 - 52.0 % 33.8(L) 34.4(L) 34.1(L)  Platelets 150 - 400 K/uL 176 144(L) 139(L)   BMP Latest Ref Rng & Units 09/15/2017 09/14/2017 09/13/2017  Glucose 65 - 99 mg/dL 125(H) 255(H) 258(H)  BUN 6 - 20 mg/dL 24(H) 25(H) 20  Creatinine 0.61 - 1.24 mg/dL 1.29(H) 1.22 1.27(H)  Sodium 135 - 145 mmol/L 140 139 140  Potassium 3.5 - 5.1 mmol/L 3.1(L) 3.0(L) 3.2(L)  Chloride 101 - 111 mmol/L 112(H) 113(H) 111  CO2 22 - 32 mmol/L 19(L) 18(L) 22  Calcium 8.9 - 10.3 mg/dL 7.5(L) 7.4(L) 7.9(L)   CBG (last 3)   Recent Labs  09/14/17 2318 09/15/17 0310 09/15/17 0745  GLUCAP 102* 93 135*    Hepatic Function Latest Ref Rng & Units 09/15/2017 09/13/2017 09/13/2017  Total Protein 6.5 - 8.1 g/dL - - -  Albumin 3.5 - 5.0 g/dL 1.5(L) 2.1(L) 2.0(L)  AST 15 - 41 U/L - - -  ALT 17 - 63 U/L - - -  Alk Phosphatase 38 - 126 U/L - - -  Total Bilirubin 0.3 - 1.2 mg/dL - - -    IMAGING/STUDIES: CT HEAD W/O 10/27:  Atrophy and significant small vessel disease.  No evidence for acute  abnormality. PORT CXR 10/28:  Personally reviewed by me. Radiology reported that endotracheal tube was at the level of the carina nearly entering right main stem. Endotracheal tube appears to be appropriately positioned by my review. No focal opacity. No pleural effusion. Enteric feeding tube coursing below diaphragm.   MICROBIOLOGY: MRSA PCR 10/27:  Negative Blood Cultures x2 10/27 >>> Urine Culture 10/27:  Negative  Sputum culture 1029 multiple species non-predominant  ANTIBIOTICS: Aztreonam 10/27 >>> Vancomycin 10/27 >>>  SIGNIFICANT EVENTS: 10/27 - Admit w/ Grand Mal seizure  10/28 - Hypotensive, bolused with 1L IVF 10/29 -  copious creamy ETT secretions  Recent Labs Lab 09/13/17 0518 09/14/17 0507 09/15/17 0454  K 3.2* 3.0* 3.1*    Lab Results  Component Value Date   CREATININE 1.29 (H) 09/15/2017   CREATININE 1.22 09/14/2017   CREATININE 1.27 (H) 09/13/2017   CREATININE 1.33 (H) 09/21/2015    Intake/Output Summary (Last 24 hours) at 09/15/17 0940 Last data filed at 09/15/17 0900  Gross per 24 hour  Intake          2869.74 ml  Output             1725 ml  Net          1144.74 ml   CBG (last 3)   Recent Labs  09/14/17 2318 09/15/17 0310 09/15/17 0745  GLUCAP 102* 93 135*       ASSESSMENT/PLAN:  76 y.o. male admitted with acute encephalopathy secondary to multiple potential etiologies. Admitted with grand mal seizures, hyperglycemia with questionable DKA versus hyperosmolar state, acute renal failure, and very brittle glucose control. 10/30 episode of bradycardia and was briefly on pressors. Required increased FIO2. 1031 started on propofol for burst suppression of seizures per neurology 1031 started on Neo-Synephrine for hypotension secondary to propofol  -Vent dependent respiratory failure in the setting of bilateral cerebral infarcts, combined metabolic and respiratory acidosis.   Continue current FiO2 and wean as tolerated. Not weanable.  -Acute encephalopathy multifactorial in nature stroke noted on MRI per neurology  -Seizures per neurology.  Currently on propofol with increased antiseizure medication per neurology.  -Hyperglycemia in the setting of diabetes.  Lantus added to pharmaceutical regimen 10/30 Noticed that Her blood glucose is decreased.  His tube feedings on hold secondary to nausea and vomiting.  Will discontinue Lantus at this time until tube feedings resume  -Electrolyte imbalance corrected 10/30 follow-up labs scheduled.  Repeat lactic acid was normal.  Potassium was repleted to 31  -Septic shock secondary to suspected urinary tract infection.  Day 4 of  vancomycin and Azactam.  No positive culture data at this time  -CODE STATUS he remains a full code at this time    Prophylaxis:  Protonix via tube daily & SCDs.  Heparin  q8hr.  Diet:  NPO. tube feeds on hold for vomiting 10/31 Code Status:  Full Code pre previous physician discussion. Disposition:  Remains critically ill in the ICU. Social Worker consulted on admission.  Family Update: 10/30 no family at bedside  App CCT 40 min  Richardson Landry Minor ACNP Maryanna Shape PCCM Pager [phone] till 1 pm If no answer page [phone] 09/15/2017, 9:35 AM   -----------  I have seen and examined the patient.  Agree with APP's Assessment and plan. Please see my comments as follows.   Remains comatose, on cEEG monitoring, Was hypotensive on propofol,  Neosynephrine was used. Breath sounds equal, no murmur, abdo-soft; tone normal. cxr- subsegmental atelectases. Labs reviewed. Images reviewed  Multiple cerebral infarcts on MR  brain, dementia, seizure, metabolic encephalopathy, acute respiratory with hypoxia, hypokalemia. D/w Neurology. To continue vimpat and keppra. Continuous EEG monitoring; Neurology discussing with family reg prognosis and goals of care. Continue vent support pending family decisions; Continue other supportive care as outlined above. Prognosis poor.  --------------------------- Thank you for letting me participate in the care of your patient. I Have personally spent 45 Minutes in addition to APP's time In the care of this Patient providing Critical care Services; Time includes review of chart, labs, imaging, coordinating care with other physicians and healthcare team members. Excludes time spent for Procedure and Teaching.  Note subject to typographical and grammatical errors; Any formal questions or concerns about the content, text, or information contained within the body of this dictation should be directly addressed to the physician for clarification.     ?Evans Lance, MD?  Pulmonary and [place] Pager: [phone]

## 2017-09-15 NOTE — Progress Notes (Signed)
eLink Physician-Brief Progress Note Patient Name: Alejandro FuseChristopher Rackers DOB: 1941-08-31 MRN: [id number]   Date of Service  09/15/2017  HPI/Events of Note  Patient with persistent episodes of hypotension.  Responds to fluid bolus but does not last.  Current BP of 71/38 (48)  eICU Interventions  Plan: Increase IVF rate to 75 cc/hr Start NEO gtt for BP support     Intervention Category Major Interventions: Hypotension - evaluation and management  [name] 09/15/2017, 12:35 AM

## 2017-09-15 NOTE — Progress Notes (Signed)
MEDICATION RELATED CONSULT NOTE  Pharmacy Consult for phenytoin Indication: seizures  Allergies  Allergen Reactions  . Coumadin [Warfarin Sodium] Palpitations    Heart races  . Penicillins Hives and Anaphylaxis    Patient Measurements: Height: 5\' 10"  (177.8 cm) Weight: 199 lb 4.7 oz (90.4 kg) IBW/kg (Calculated) : 73  Vital Signs: Temp: 98.5 F (36.9 C) (10/31 0400) Temp Source: Axillary (10/31 0400) BP: 122/79 (10/31 0700) Pulse Rate: 64 (10/31 0700) Intake/Output from previous day: 10/30 0701 - 10/31 0700 In: 2557.5 [I.V.:993.4; NG/GT:717.5; IV Piggyback:846.7] Out: 900 [Urine:900] Intake/Output from this shift: No intake/output data recorded.  Estimated Creatinine Clearance: 54.3 mL/min (A) (by C-G formula based on SCr of 1.29 mg/dL (H)).  Medications:  Keppra 500mg  IV Q12H Lacosamide 100mg  IV Q12H Phenytoin on hold  Assessment: [age] yom with seizures continues on anti-epileptic regimen noted above. Phenytoin level remains elevated 12.9 which corrects to 32 with low albumin of 1.5.   Goal of Therapy:  Total Phenytoin 10-20 Free Phenytoin 1-2  Plan:  Continue to hold phenytoin F/u AM level and albumin level  Pollyann SamplesAndy Deliana Avalos, PharmD, BCPS 09/15/2017, 7:44 AM

## 2017-09-15 NOTE — Progress Notes (Signed)
Pharmacy Antibiotic Note [name] is a 76 y.o. male admitted on 08/19/2017 with status epilepticus thought to be 2/2 to non-ketotic hyperosmolar coma. Currently on day 5 of Azactam and vancomycin for empiric coverage.   Plan: 1. Continue vancomycin 750 mg IV every 12 hours  2. Will obtain vancomycin trough prior to 0800 dose on 11/1; goal trough 15-20 3. Azactam 1 gram IV every 8 hours  Height: 5\' 10"  (177.8 cm) Weight: 199 lb 4.7 oz (90.4 kg) IBW/kg (Calculated) : 73  Temp (24hrs), Avg:98.6 F (37 C), Min:97.3 F (36.3 C), Max:100.1 F (37.8 C)   Recent Labs Lab 09/09/2017 1129 08/27/2017 1137 08/24/2017 1455  09/12/17 0350 09/12/17 1143 09/12/17 1700 09/13/17 0518 09/13/17 1134 09/14/17 0507 09/14/17 0917 09/15/17 0454  WBC 17.0*  --   --   --  17.1*  --   --  14.6*  --  13.0*  --  18.1*  CREATININE 2.37* 1.80*  --   < > 1.57*  --  1.29* 1.27*  --  1.22  --  1.29*  LATICACIDVEN  --  8.98* 4.05*  --   --  1.6  --   --  2.4*  --  1.6  --   < > = values in this interval not displayed.  Estimated Creatinine Clearance: 54.3 mL/min (A) (by C-G formula based on SCr of 1.29 mg/dL (H)).    Allergies  Allergen Reactions  . Coumadin [Warfarin Sodium] Palpitations    Heart races  . Penicillins Hives and Anaphylaxis    Antimicrobials this admission: 10/27 Azactam >>  10/27 vancomycin >>   Microbiology results: 10/27 BCx: ngtd 10/27 UCx: negative  10/28 MRSA PCR: negative  10/29 trach aspirate: pending   Thank you for allowing pharmacy to be a part of this patient's care.  Pollyann SamplesAndy Latona Krichbaum, PharmD, BCPS 09/15/2017, 7:52 AM

## 2017-09-16 ENCOUNTER — Inpatient Hospital Stay (HOSPITAL_COMMUNITY): Payer: Medicare Other

## 2017-09-16 ENCOUNTER — Encounter (HOSPITAL_COMMUNITY): Payer: Self-pay

## 2017-09-16 LAB — BLOOD CULTURE ID PANEL (REFLEXED)
ACINETOBACTER BAUMANNII: NOT DETECTED
CANDIDA ALBICANS: NOT DETECTED
CANDIDA GLABRATA: NOT DETECTED
CANDIDA KRUSEI: NOT DETECTED
CANDIDA PARAPSILOSIS: NOT DETECTED
CANDIDA TROPICALIS: NOT DETECTED
ENTEROBACTER CLOACAE COMPLEX: NOT DETECTED
ENTEROBACTERIACEAE SPECIES: NOT DETECTED
ESCHERICHIA COLI: NOT DETECTED
Enterococcus species: NOT DETECTED
Haemophilus influenzae: NOT DETECTED
KLEBSIELLA OXYTOCA: NOT DETECTED
KLEBSIELLA PNEUMONIAE: NOT DETECTED
Listeria monocytogenes: NOT DETECTED
NEISSERIA MENINGITIDIS: NOT DETECTED
Proteus species: NOT DETECTED
Pseudomonas aeruginosa: NOT DETECTED
STREPTOCOCCUS AGALACTIAE: NOT DETECTED
Serratia marcescens: NOT DETECTED
Staphylococcus aureus (BCID): NOT DETECTED
Staphylococcus species: NOT DETECTED
Streptococcus pneumoniae: NOT DETECTED
Streptococcus pyogenes: NOT DETECTED
Streptococcus species: NOT DETECTED

## 2017-09-16 LAB — CULTURE, BLOOD (ROUTINE X 2)
CULTURE: NO GROWTH
SPECIAL REQUESTS: ADEQUATE

## 2017-09-16 LAB — GLUCOSE, CAPILLARY
GLUCOSE-CAPILLARY: 115 mg/dL — AB (ref 65–99)
GLUCOSE-CAPILLARY: 69 mg/dL (ref 65–99)
GLUCOSE-CAPILLARY: 103 mg/dL — AB (ref 65–99)
Glucose-Capillary: 69 mg/dL (ref 65–99)

## 2017-09-16 LAB — TRIGLYCERIDES: Triglycerides: 153 mg/dL — ABNORMAL HIGH (ref <150)

## 2017-09-16 MED ORDER — DEXTROSE 50 % IV SOLN
INTRAVENOUS | Status: AC
  Administered 2017-09-16: 25 mL
  Filled 2017-09-16: qty 50

## 2017-09-16 MED ORDER — DEXTROSE 50 % IV SOLN
INTRAVENOUS | Status: AC
  Administered 2017-09-16: 09:00:00
  Filled 2017-09-16: qty 50

## 2017-09-16 MED ORDER — FAMOTIDINE 40 MG/5ML PO SUSR
20.0000 mg | Freq: Two times a day (BID) | ORAL | Status: DC
  Filled 2017-09-16 (×2): qty 2.5

## 2017-09-16 MED ORDER — SODIUM CHLORIDE 0.9 % IV SOLN
10.0000 mg/h | INTRAVENOUS | Status: DC
  Administered 2017-09-16: 10 mg/h via INTRAVENOUS
  Filled 2017-09-16: qty 10

## 2017-09-16 MED ORDER — MORPHINE BOLUS VIA INFUSION
5.0000 mg | INTRAVENOUS | Status: DC | PRN
  Filled 2017-09-16: qty 20

## 2017-09-16 MED ORDER — LORAZEPAM 2 MG/ML IJ SOLN
5.0000 mg/h | INTRAVENOUS | Status: DC
  Administered 2017-09-16: 5 mg/h via INTRAVENOUS
  Filled 2017-09-16: qty 25

## 2017-09-16 MED ORDER — LORAZEPAM BOLUS VIA INFUSION
2.0000 mg | INTRAVENOUS | Status: DC | PRN
  Filled 2017-09-16: qty 5

## 2017-09-16 DEATH — deceased

## 2017-09-19 LAB — CULTURE, BLOOD (ROUTINE X 2): Special Requests: ADEQUATE

## 2017-10-04 ENCOUNTER — Telehealth: Payer: Self-pay

## 2017-10-04 NOTE — Telephone Encounter (Signed)
On 10/04/2017 I received a d/c from [facility]. The original d/c had too many errors on it. The d[facility] new [facility]/c.  On 10/12/17 I received the d/c back from [facility]. I got the d/c ready and called Alona BeneJoyce with [facility] [employer] to let her know the d/c is ready for pickup.

## 2017-10-16 NOTE — Procedures (Signed)
LTM-EEG Report  HISTORY: Continuous video-EEG monitoring performed for [age]year old with encephalopathy, possible seizures.  ACQUISITION: International 10-20 system for electrode placement; 18 channels with additional eyes linked to ipsilateral ears and EKG. Additional T1-T2 electrodes were used. Continuous video recording obtained.   EEG NUMBER:  MEDICATIONS:  Day 1: LEV, LCM Day 2: LEV, LCM  DAY #1: from 1022 09/14/17 to 0730 09/15/17  BACKGROUND: An overall medium voltage, mostly continuous recording withsomespontaneous variability and reactivity. The background consisted of medium voltagedelta-theta activity bilaterally with sparse superimposed faster frequencies (often increased in the left anterior regions) and no clear evidence of a posterior basicrhythm. State changes were captured but without apparent sleep architecture. Some periods of attenuation were seen with increased sedation. EPILEPTIFORM/PERIODIC ACTIVITY: There were frequent left anterior sharp discharges, often in a periodic pattern (LPDs), frequency 1hz  without ictal evolution or clinical changes. These were stable throughout the recording.  SEIZURES: none EVENTS: There were no reported clinical events  DAY #2: from 0730 09/15/17 to 0730 09/18/2017  BACKGROUND: An overall low voltage, sedated recording with minimalspontaneous variability and reactivity. The recording consisted of mainly low voltage delta-theta activity bilaterally (less prominent over the right) with no evidence of a posterior basic rhythm. No clear state changes were seen.  EPILEPTIFORM/PERIODIC ACTIVITY: resolution of LPD activity with increasing sedation  SEIZURES: none EVENTS: There were no reported clinical events  EKG: no significant arrhythmia  SUMMARY: This was an abnormal continuous video EEG due to diffuse slow activity and a sedated background currently. There appeared to be resolution of left anteiror periodic discharges with increasing  sedation over the past 24 hours. No seizures were seen.

## 2017-10-16 NOTE — Progress Notes (Signed)
Patient D'C'd from all medications and treatments. Comfort measures including Morphine gtt were initiated. Extubated at bedside with Respiratory. Family at the bedside. Marshal Schrecengost, [place] ScrapeSarah E, RN

## 2017-10-16 NOTE — Significant Event (Signed)
Spoke with family concerning goals of care. They agree with stopping Diprivan and weaning pressors to off prior to terminal extubation. We will change code status to DNR. Plan for terminal extubation once sedation is off. Will have MSO4 drip ready at time of extubation.  Recent Labs Lab 09/13/17 0518 09/14/17 0507 09/15/17 0454  NA 140 139 140  K 3.2* 3.0* 3.1*  CL 111 113* 112*  CO2 22 18* 19*  BUN 20 25* 24*  CREATININE 1.27* 1.22 1.29*  GLUCOSE 258* 255* 125*    Recent Labs Lab 09/13/17 0518 09/14/17 0507 09/15/17 0454  HGB 11.1* 11.1* 10.9*  HCT 34.1* 34.4* 33.8*  WBC 14.6* 13.0* 18.1*  PLT 139* 144* 176    Dg Chest Port 1 View  Result Date: 09/28/2017 CLINICAL DATA:  Respiratory failure. EXAM: PORTABLE CHEST 1 VIEW COMPARISON:  09/15/2017 . FINDINGS: Endotracheal tube and NG tube in stable position. Stable cardiomegaly. Persistent bilateral pulmonary infiltrates/ edema, slightly progressed from prior exam. Mild basilar atelectasis. Small bilateral pleural effusion noted on today's exam. No pneumothorax. No acute bony abnormality . IMPRESSION: 1. Lines and tubes in stable position. 2. Stable cardiomegaly. Bilateral pulmonary infiltrates/ edema again noted slightly progressed from prior exam. Small bilateral pleural effusions noted on today's exam . Electronically Signed   By: Maisie Fushomas  Register   On: Mar 07, 2017 09:05   Dg Chest Port 1 View  Result Date: 09/15/2017 CLINICAL DATA:  Respiratory failure EXAM: PORTABLE CHEST 1 VIEW COMPARISON:  09/13/2017 FINDINGS: Support devices scratched at endotracheal tube and NG tube are unchanged. Mild cardiomegaly. Bilateral perihilar and lower lobe airspace opacities again noted. No real change. No definite effusions. No acute bony abnormality. IMPRESSION: Stable perihilar and bibasilar opacities. This could reflect atelectasis or pneumonia. Electronically Signed   By: Charlett NoseKevin  Dover M.D.   On: 09/15/2017 08:31   Dg Abd Portable  1v  Result Date: 09/14/2017 CLINICAL DATA:  Vomiting EXAM: PORTABLE ABDOMEN - 1 VIEW COMPARISON:  09/05/2017 FINDINGS: NG tube in the gastric antrum unchanged. Progressive colonic distention. Moderate stool in the colon. Small bowel nondilated. IMPRESSION: Progression of colonic ileus.  Constipation. NG tube in the stomach. Electronically Signed   By: Marlan Palauharles  Clark M.D.   On: 09/14/2017 20:03   Dr. Sherren KernsSivakumar aware of plan.  Brett CanalesSteve Jameer Storie ACNP [facility] PCCM Pager [phone] till 1 pm If no answer page [phone] 10/09/2017, 12:37 PM

## 2017-10-16 NOTE — Progress Notes (Signed)
Neurology Progress Note   S:// Seen and examined Per nursing, no gag, cough and pupils also very sluggish if that Family from [place] and [place] present at bedside. I had a detailed discussion with the family and answered all the questions regarding his current neurological status.  O:// Current vital signs: BP 108/80   Pulse (!) 58   Temp (!) 97.5 F (36.4 C) (Axillary)   Resp 16   Ht '5\' 10"'$  (1.778 m)   Wt 93.7 kg (206 lb 9.1 oz)   SpO2 100%   BMI 29.64 kg/m  Vital signs in last 24 hours: Temp:  [97.4 F (36.3 C)-98.2 F (36.8 C)] 97.5 F (36.4 C) (11/01 1114) Pulse Rate:  [52-79] 58 (11/01 1000) Resp:  [14-18] 16 (11/01 1000) BP: (79-133)/(59-87) 108/80 (11/01 1000) SpO2:  [97 %-100 %] 100 % (11/01 1000) FiO2 (%):  [30 %-40 %] 30 % (11/01 0730) Weight:  [93.7 kg (206 lb 9.1 oz)] 93.7 kg (206 lb 9.1 oz) (11/01 0400) Intubated, sedated, does not open eyes to voice is noxious stim.  Does not follow any commands. Pupils are equal, very mildly reactive if that.  Disconjugate gaze with right exotropia.  No cough or gag. No corneals. Motor exam: No spontaneous movement noted.  No withdrawal to noxious stim in upper extremity.  May be slight triple flexion in the right lower extremity.  Medications  Current Facility-Administered Medications:  .  Place/Maintain arterial line, , , Until Discontinued **AND** 0.9 %  sodium chloride infusion, , Intra-arterial, PRN, Zaaqoq, Akram M, MD .  0.9 %  sodium chloride infusion, 250 mL, Intravenous, PRN, Deterding, Guadelupe Sabin, MD .  atorvastatin (LIPITOR) tablet 40 mg, 40 mg, Per Tube, q1800, Javier Glazier, MD, 40 mg at 09/15/17 1724 .  aztreonam (AZACTAM) 1 g in dextrose 5 % 50 mL IVPB, 1 g, Intravenous, Q8H, Jamesetta So, MD, Stopped at October 05, 2017 0531 .  chlorhexidine gluconate (MEDLINE KIT) (PERIDEX) 0.12 % solution 15 mL, 15 mL, Mouth Rinse, BID, Crim, Courtney, MD, 15 mL at 10/05/17 0759 .  clopidogrel (PLAVIX) tablet 75 mg, 75 mg, Per  Tube, Daily, Javier Glazier, MD, 75 mg at October 05, 2017 0929 .  famotidine (PEPCID) 40 MG/5ML suspension 20 mg, 20 mg, Per Tube, BID, Sivakumar, Siva P, MD .  feeding supplement (VITAL AF 1.2 CAL) liquid 1,000 mL, 1,000 mL, Per Tube, Continuous, Nestor, Sonia Baller, MD, Stopped at 09/14/17 1715 .  fentaNYL (SUBLIMAZE) injection 25-50 mcg, 25-50 mcg, Intravenous, Q1H PRN, Javier Glazier, MD, 50 mcg at 09/14/17 1731 .  heparin injection 5,000 Units, 5,000 Units, Subcutaneous, Q8H, Javier Glazier, MD, 5,000 Units at 10/05/2017 0504 .  insulin aspart (novoLOG) injection 0-15 Units, 0-15 Units, Subcutaneous, Q4H, Anders Simmonds, MD, 2 Units at 09/15/17 1606 .  lacosamide (VIMPAT) 100 mg in sodium chloride 0.9 % 25 mL IVPB, 100 mg, Intravenous, Q12H, Greta Doom, MD, Stopped at October 05, 2017 [phone] .  levETIRAcetam (KEPPRA) 500 mg in sodium chloride 0.9 % 100 mL IVPB, 500 mg, Intravenous, Q12H, Greta Doom, MD, Last Rate: 420 mL/hr at 10/05/2017 0639, 500 mg at 10-05-17 0639 .  MEDLINE mouth rinse, 15 mL, Mouth Rinse, 10 times per day, Jamesetta So, MD, 15 mL at 10-05-17 0926 .  phenylephrine (NEO-SYNEPHRINE) 10 mg in sodium chloride 0.9 % 250 mL (0.04 mg/mL) infusion, 0-400 mcg/min, Intravenous, Titrated, Deterding, Guadelupe Sabin, MD, Last Rate: 105 mL/hr at October 05, 2017 1000, 70 mcg/min at October 05, 2017 1000 .  pregabalin (LYRICA) capsule 150  mg, 150 mg, Oral, TID, Greta Doom, MD, 150 mg at 09-20-17 1000 .  propofol (DIPRIVAN) 1000 MG/100ML infusion, 50 mcg/kg/min, Intravenous, Titrated, Greta Doom, MD, Last Rate: 26.9 mL/hr at 2017-09-20 1000, 50 mcg/kg/min at 2017/09/20 1000 Labs CBC    Component Value Date/Time   WBC 18.1 (H) 09/15/2017 0454   RBC 4.36 09/15/2017 0454   HGB 10.9 (L) 09/15/2017 0454   HCT 33.8 (L) 09/15/2017 0454   PLT 176 09/15/2017 0454   MCV 77.5 (L) 09/15/2017 0454   MCH 25.0 (L) 09/15/2017 0454   MCHC 32.2 09/15/2017 0454   RDW 15.5 09/15/2017  0454   LYMPHSABS 1.3 09/13/2017 0518   MONOABS 1.6 (H) 09/13/2017 0518   EOSABS 0.0 09/13/2017 0518   BASOSABS 0.0 09/13/2017 0518    CMP     Component Value Date/Time   NA 140 09/15/2017 0454   K 3.1 (L) 09/15/2017 0454   CL 112 (H) 09/15/2017 0454   CO2 19 (L) 09/15/2017 0454   GLUCOSE 125 (H) 09/15/2017 0454   BUN 24 (H) 09/15/2017 0454   CREATININE 1.29 (H) 09/15/2017 0454   CREATININE 1.33 (H) 09/21/2015 1333   CALCIUM 7.5 (L) 09/15/2017 0454   PROT 8.7 (H) 09/03/2017 1129   ALBUMIN 1.5 (L) 09/15/2017 0454   AST 31 08/23/2017 1129   ALT 16 (L) 08/20/2017 1129   ALKPHOS 87 08/28/2017 1129   BILITOT 0.8 09/02/2017 1129   GFRNONAA 52 (L) 09/15/2017 0454   GFRAA >60 09/15/2017 0454    glycosylated hemoglobin  Lipid Panel     Component Value Date/Time   CHOL 140 09/13/2017 0518   TRIG 153 (H) 20-Sep-2017 0910   HDL 43 09/13/2017 0518   CHOLHDL 3.3 09/13/2017 0518   VLDL 14 09/13/2017 0518   LDLCALC 83 09/13/2017 0518   Imaging I have reviewed images in epic and the results pertinent to this consultation are: MRI of the brain done on 09/12/2017 shows acute infarcts in both cerebral hemispheres. Right temporoparietal cortex and white matter as well as left lateral periventricular areas of infarct. No evidence of bleed. Advanced chronic small vessel disease.  Stroke workup Carotid Dopplers showed no evidence of significant stenosis in bilateral internal carotids. Flow in the verts is antegrade.  Echocardiogram shows left ventricular ejection fraction 55-60%. No regional wall motion abnormalities. No PFO on TTE. Trivial aortic regurgitation.Left atrium normal in size.  LDL 83 A1c 14.4  LTM EEG from overnight was abnormal due to diffuse slowing and sedated background activity.  There was resolution of the left anterior periodic discharges with increased sedation over the past 24 hours.  Assessment and plan:  76 year old man who presented with focal status  epilepticus in the setting of severe hyperglycemia and required intubation for airway protection after seizures did not respond to benzos, and required propofol tube or clinical seizures.  Initial EEG suggestive of seizures in the early part of the recordings that resolved after Dilantin load. Has had a complicated course with continued poor neurological exam even with resolution of electrographic seizures, with recurrence of epileptogenic activity again on the EEG which resolved with increasing sedation.  He also has had multiple embolic strokes that are acute.  I had a detailed discussion with family regarding goals of care and they agreed that given his poor baseline health including advanced dementia and poorly controlled diabetes along with multiple cerebrovascular events in the past, he will have a poor chance of neurologically meaningful recovery. The family told me that he  has made it clear that he would not like to be continued on life support if there was no chance of neurologically meaningful recovery. At this time, I believe, that the chances of a neurological meaningful recovery are grim. The family has elected to go for a compassionate extubation and comfort measures only. I have requested that the LTM needs to be removed. Comfort measures to be initiated after extubation by the primary team. Neurology team will be available as needed.  -- Amie Portland, MD [facility] [phone] If 7pm to 7am, please call on call as listed on AMION.

## 2017-10-16 NOTE — Progress Notes (Signed)
Got ahold of patient's daughter Alejandro [name]. She is on the way to the hospital now. Arbell Wycoff, [place] ScrapeSarah E, RN

## 2017-10-16 NOTE — Progress Notes (Signed)
Patient with cardiac death of 1746. Two RN Leotis Shames(Lauren and AngolaEllie) helped to verify. Attempted to call family but no answers from all of the numbers listed. Will keep trying. CDS notified.  Post-mortem care underway. Shon Indelicato, [place] ScrapeSarah E, RN

## 2017-10-16 NOTE — Progress Notes (Signed)
Palliative:  I have read notes that Mr. Alejandro [name] has been extubated to comfort already. Spoke with Maralyn SagoSarah, RN and patient comfortable and no palliative needs at this time. Please call [phone] if palliative needs arise. Thank you for this consult. Will sign off.   No charge  Yong ChannelAlicia Hilary Milks, NP Palliative Medicine Team Pager # [phone] (M-F 8a-5p) Team Phone # [phone] (Nights/Weekends)

## 2017-10-16 NOTE — Progress Notes (Signed)
PHARMACY - PHYSICIAN COMMUNICATION CRITICAL VALUE ALERT - BLOOD CULTURE IDENTIFICATION (BCID)  Results for orders placed or performed during the hospital encounter of 08/31/2017  Blood Culture ID Panel (Reflexed) (Collected: 08/16/2017  4:52 PM)  Result Value Ref Range   Enterococcus species NOT DETECTED NOT DETECTED   Listeria monocytogenes NOT DETECTED NOT DETECTED   Staphylococcus species NOT DETECTED NOT DETECTED   Staphylococcus aureus NOT DETECTED NOT DETECTED   Streptococcus species NOT DETECTED NOT DETECTED   Streptococcus agalactiae NOT DETECTED NOT DETECTED   Streptococcus pneumoniae NOT DETECTED NOT DETECTED   Streptococcus pyogenes NOT DETECTED NOT DETECTED   Acinetobacter baumannii NOT DETECTED NOT DETECTED   Enterobacteriaceae species NOT DETECTED NOT DETECTED   Enterobacter cloacae complex NOT DETECTED NOT DETECTED   Escherichia coli NOT DETECTED NOT DETECTED   Klebsiella oxytoca NOT DETECTED NOT DETECTED   Klebsiella pneumoniae NOT DETECTED NOT DETECTED   Proteus species NOT DETECTED NOT DETECTED   Serratia marcescens NOT DETECTED NOT DETECTED   Haemophilus influenzae NOT DETECTED NOT DETECTED   Neisseria meningitidis NOT DETECTED NOT DETECTED   Pseudomonas aeruginosa NOT DETECTED NOT DETECTED   Candida albicans NOT DETECTED NOT DETECTED   Candida glabrata NOT DETECTED NOT DETECTED   Candida krusei NOT DETECTED NOT DETECTED   Candida parapsilosis NOT DETECTED NOT DETECTED   Candida tropicalis NOT DETECTED NOT DETECTED    Name of physician (or Provider) Contacted: Dr, Sherren KernsSivakumar   Changes to prescribed antibiotics required: No changes at this time. Currently on Aztreonam + Vanc. Continue to follow cultures.   Einar CrowKatherine Eddrick Dilone, PharmD Clinical Pharmacist 09/21/17 7:10 AM

## 2017-10-16 NOTE — Progress Notes (Signed)
Maintenanced LTM, no skin breakdown was seen.

## 2017-10-16 NOTE — Progress Notes (Signed)
 Inpatient Diabetes Program Recommendations  AACE/ADA: New Consensus Statement on Inpatient Glycemic Control (2015)  Target Ranges:  Prepandial:   less than 140 mg/dL      Peak postprandial:   less than 180 mg/dL (1-2 hours)      Critically ill patients:  140 - 180 mg/dL   Lab Results  Component Value Date   GLUCAP 69 09/21/2017   HGBA1C 14.4 (H) 09/13/2017    Review of Glycemic Control Results for Alejandro [name], Alejandro [name] (MRN [id number]) as of  10:13  Ref. Range 09/15/2017 23:21 09/15/2017 23:52 10/14/2017 03:35 09/18/2017 04:00 09/25/2017 08:13  Glucose-Capillary Latest Ref Range: 65 - 99 mg/dL 57 (L) 952118 (H) 69 841115 (H) 69   Inpatient Diabetes Program Recommendations:   -Decrease Novolog correction to sensitive  Thank you, Darel HongJudy E. Jasani Lengel, RN, MSN, CDE  Diabetes Coordinator Inpatient Glycemic Control Team Team Pager [phone] (8am-5pm) 09/29/2017 10:14 AM

## 2017-10-16 NOTE — Progress Notes (Signed)
RN wasted 175mL of Morphine and 25mL of Ativan into the sink with Insurance claims handlerTaryn RN. Tichina Koebel, [place] ScrapeSarah E, RN

## 2017-10-16 NOTE — Progress Notes (Addendum)
Patient was getting ready to be extubated. Large family surrounding him and very receptive and appreciative of chaplain's visitation. Family shared about patients faith and involvement with service to church and community. Chaplain provided reflective listening, compassionate presence and prayer.   Chaplain Maddyson Keil.

## 2017-10-16 NOTE — Death Summary Note (Signed)
[facility]      date of death 09-21-2017 Time  17.46hrs   Admission Date:  09/12/2017  Presenting HPI:  76 y.o. male brought in by his son after being found unresponsive in his home. Unknown down time. Known history of diabetes mellitus type 2, essential hypertension, and history of stroke with residual left-sided hemiparesis. Upon EMS arrival patient had grand mal seizure and was treated with Ativan. Patient had 2 subsequent seizures in route to the emergency department. Patient subsequently received fosphenytoin and Keppra to control his seizure. He was endotracheally intubated and started on a propofol infusion. Initial glucose was greater than 700.   see H & P for details .   CBC Latest Ref Rng & Units 09/15/2017 09/14/2017 09/13/2017  WBC 4.0 - 10.5 K/uL 18.1(H) 13.0(H) 14.6(H)  Hemoglobin 13.0 - 17.0 g/dL 10.9(L) 11.1(L) 11.1(L)  Hematocrit 39.0 - 52.0 % 33.8(L) 34.4(L) 34.1(L)  Platelets 150 - 400 K/uL 176 144(L) 139(L)   BMP Latest Ref Rng & Units 09/15/2017 09/14/2017 09/13/2017  Glucose 65 - 99 mg/dL 125(H) 255(H) 258(H)  BUN 6 - 20 mg/dL 24(H) 25(H) 20  Creatinine 0.61 - 1.24 mg/dL 1.29(H) 1.22 1.27(H)  Sodium 135 - 145 mmol/L 140 139 140  Potassium 3.5 - 5.1 mmol/L 3.1(L) 3.0(L) 3.2(L)  Chloride 101 - 111 mmol/L 112(H) 113(H) 111  CO2 22 - 32 mmol/L 19(L) 18(L) 22  Calcium 8.9 - 10.3 mg/dL 7.5(L) 7.4(L) 7.9(L)   CBG (last 3)   Recent Labs  09-21-2017 0335 2017/09/21 0400 09/21/17 0813  GLUCAP 69 115* 69    Hepatic Function Latest Ref Rng & Units 09/15/2017 09/13/2017 09/13/2017  Total Protein 6.5 - 8.1 g/dL - - -  Albumin 3.5 - 5.0 g/dL 1.5(L) 2.1(L) 2.0(L)  AST 15 - 41 U/L - - -  ALT 17 - 63 U/L - - -  Alk Phosphatase 38 - 126 U/L - - -  Total Bilirubin 0.3 - 1.2 mg/dL - - -    IMAGING/STUDIES: CT HEAD W/O 10/27:  Atrophy and significant small vessel disease. No evidence for acute abnormality. PORT CXR  10/28:  Personally reviewed by me. Radiology reported that endotracheal tube was at the level of the carina nearly entering right main stem. Endotracheal tube appears to be appropriately positioned by my review. No focal opacity. No pleural effusion. Enteric feeding tube coursing below diaphragm.   MICROBIOLOGY: MRSA PCR 10/27:  Negative Blood Cultures x2 10/27 >>>gpc one of two most likely contaminate  Urine Culture 10/27:  Negative  Sputum culture 1029 multiple species non-predominant  ANTIBIOTICS: Aztreonam 10/27 >>> Vancomycin 10/27 >>>11/1  SIGNIFICANT EVENTS: 10/27 - Admit w/ Grand Mal seizure  10/28 - Hypotensive, bolused with 1L IVF 10/29 - copious creamy ETT secretions 21-Sep-2017 continue with the EEG continues to show activity despite sedation.  Recent Labs Lab 09/13/17 0518 09/14/17 0507 09/15/17 0454  K 3.2* 3.0* 3.1*         Lab Results  Component Value Date   CREATININE 1.29 (H) 09/15/2017   CREATININE 1.22 09/14/2017   CREATININE 1.27 (H) 09/13/2017   CREATININE 1.33 (H) 09/21/2015    Intake/Output Summary (Last 24 hours) at 2017/09/21 0904 Last data filed at 09/21/17 0800  Gross per 24 hour  Intake          3146.79 ml  Output             1275 ml  Net  1871.79 ml   CBG (last 3)   Recent Labs  10/04/2017 0335 2017/10/04 0400 10/04/2017 0813  GLUCAP 69 115* 69       ASSESSMENT/PLAN:  76 y.o. male admitted with acute encephalopathy secondary to multiple potential etiologies. Admitted with grand mal seizures, hyperglycemia with questionable DKA versus hyperosmolar state, acute renal failure, and very brittle glucose control.   -Vent dependent respiratory failure in the setting of bilateral cerebral infarcts, combined metabolic and respiratory acidosis.   Continue current FiO2 and wean as tolerated. Not weanable.  -Acute encephalopathy multifactorial in nature stroke noted on MRI per neurology  -Seizures per neurology.   Currently on propofol with increased antiseizure medication per neurology.  Continues to have seizure activity despite being on propofol.  Has finished a 24-hour EEG continues to have focal seizures despite sedation.  This is indicative of poor prognosis.  -Hyperglycemia in the setting of diabetes.  CBG (last 3)   Recent Labs  Oct 04, 2017 0335 10/04/2017 0400 04-Oct-2017 0813  GLUCAP 69 115* 69    -Electrolyte imbalance corrected 10/30 follow-up labs scheduled.  Repeat lactic acid was normal.  Potassium was replete.  Labs from 10/04/2017 are pending at this time. We will correct electrolytes as needed when labs are available.  -Septic shock secondary to suspected urinary tract infection.  Day 5 of vancomycin and Azactam.  No positive culture data at this time. Will dc vanco 11/1. Sepsis resolved.      Neurology  Had  ongoing discussions with the family.      comfort care  Was pursued by family.   .  His prognosis  Was extremely poor and he  made no progress whatsoever.     Multiple cerebral infarcts on MR brain, dementia, seizure, metabolic encephalopathy, acute respiratory  Failure.   Extubated to comfort care.  Pt expired peacefully-  ---------------------------      Evans Lance, MD  Pulmonary and [place] Pager: [phone]

## 2017-10-16 NOTE — Progress Notes (Addendum)
Alejandro [name]  Admission Date:  08/21/2017  Presenting HPI:  76 y.o. male brought in by his son after being found unresponsive in his home. Unknown down time. Known history of diabetes mellitus type 2, essential hypertension, and history of stroke with residual left-sided hemiparesis. Upon EMS arrival patient had grand mal seizure and was treated with Ativan. Patient had 2 subsequent seizures in route to the emergency department. Patient subsequently received fosphenytoin and Keppra to control his seizure. He was endotracheally intubated and started on a propofol infusion. Initial glucose was greater than 700.  Subjective: Electrolyte activity EEG despite sedation.  Neurology feels his prognosis extremely poor.  Question of evolving palliative care and transient transition to comfort care is being addressed.  Review of Systems:  Unable to obtain given altered mentation, intubation & sedation.   Vent Mode: PRVC FiO2 (%):  [30 %-40 %] 30 % Set Rate:  [15 bmp] 15 bmp Vt Set:  [580 mL] 580 mL PEEP:  [5 cmH20] 5 cmH20 Plateau Pressure:  [12 PTW65-68 cmH20] 17 cmH20  Temp:  [97.4 F (36.3 C)-98.2 F (36.8 C)] 97.4 F (36.3 C) (11/01 0800) Pulse Rate:  [52-79] 52 (11/01 0800) Resp:  [14-18] 15 (11/01 0800) BP: (79-133)/(59-87) 116/82 (11/01 0800) SpO2:  [97 %-100 %] 100 % (11/01 0800) FiO2 (%):  [30 %-40 %] 30 % (11/01 0730) Weight:  [206 lb 9.1 oz (93.7 kg)] 206 lb 9.1 oz (93.7 kg) (11/01 0400)  General: Elderly male sedated on a ventilator HEENT: Endotracheal tube connected to ventilator NG tube connected to suction PSY: Not applicable Neuro: Not responsive to noxious stimuli no gag or cough reflex negative bowel sounds CV:  heart sounds are distant PULM: Decreased breath sounds at the bases with mild rhonchi LE:XNTZ, non-tender, bsx4 active  Extremities: warm/dry, 2+ continues to have edema  Skin: no rashes or lesions    LINES/TUBES: OETT  8.0 10/27  >>> R RAD ART LINE 10/27 >>> OGT 10/27 >>> PIV  CBC Latest Ref Rng & Units 09/15/2017 09/14/2017 09/13/2017  WBC 4.0 - 10.5 K/uL 18.1(H) 13.0(H) 14.6(H)  Hemoglobin 13.0 - 17.0 g/dL 10.9(L) 11.1(L) 11.1(L)  Hematocrit 39.0 - 52.0 % 33.8(L) 34.4(L) 34.1(L)  Platelets 150 - 400 K/uL 176 144(L) 139(L)   BMP Latest Ref Rng & Units 09/15/2017 09/14/2017 09/13/2017  Glucose 65 - 99 mg/dL 125(H) 255(H) 258(H)  BUN 6 - 20 mg/dL 24(H) 25(H) 20  Creatinine 0.61 - 1.24 mg/dL 1.29(H) 1.22 1.27(H)  Sodium 135 - 145 mmol/L 140 139 140  Potassium 3.5 - 5.1 mmol/L 3.1(L) 3.0(L) 3.2(L)  Chloride 101 - 111 mmol/L 112(H) 113(H) 111  CO2 22 - 32 mmol/L 19(L) 18(L) 22  Calcium 8.9 - 10.3 mg/dL 7.5(L) 7.4(L) 7.9(L)   CBG (last 3)   Recent Labs  09/17/2017 0335 2017-09-17 0400 09-17-2017 0813  GLUCAP 69 115* 69    Hepatic Function Latest Ref Rng & Units 09/15/2017 09/13/2017 09/13/2017  Total Protein 6.5 - 8.1 g/dL - - -  Albumin 3.5 - 5.0 g/dL 1.5(L) 2.1(L) 2.0(L)  AST 15 - 41 U/L - - -  ALT 17 - 63 U/L - - -  Alk Phosphatase 38 - 126 U/L - - -  Total Bilirubin 0.3 - 1.2 mg/dL - - -    IMAGING/STUDIES: CT HEAD W/O 10/27:  Atrophy and significant small vessel disease.  No evidence for acute  abnormality. PORT CXR 10/28:  Personally reviewed by me. Radiology reported that endotracheal tube was at  the level of the carina nearly entering right main stem. Endotracheal tube appears to be appropriately positioned by my review. No focal opacity. No pleural effusion. Enteric feeding tube coursing below diaphragm.   MICROBIOLOGY: MRSA PCR 10/27:  Negative Blood Cultures x2 10/27 >>>gpc one of two most likely contaminate  Urine Culture 10/27:  Negative  Sputum culture 1029 multiple species non-predominant  ANTIBIOTICS: Aztreonam 10/27 >>> Vancomycin 10/27 >>>11/1  SIGNIFICANT EVENTS: 10/27 - Admit w/ Grand Mal seizure  10/28 - Hypotensive, bolused with 1L IVF 10/29 - copious creamy ETT  secretions 09-30-17 continue with the EEG continues to show activity despite sedation.  Recent Labs Lab 09/13/17 0518 09/14/17 0507 09/15/17 0454  K 3.2* 3.0* 3.1*    Lab Results  Component Value Date   CREATININE 1.29 (H) 09/15/2017   CREATININE 1.22 09/14/2017   CREATININE 1.27 (H) 09/13/2017   CREATININE 1.33 (H) 09/21/2015    Intake/Output Summary (Last 24 hours) at September 30, 2017 0904 Last data filed at 09/30/17 0800  Gross per 24 hour  Intake          3146.79 ml  Output             1275 ml  Net          1871.79 ml   CBG (last 3)   Recent Labs  2017-09-30 0335 2017/09/30 0400 09-30-17 0813  GLUCAP 69 115* 69       ASSESSMENT/PLAN:  76 y.o. male admitted with acute encephalopathy secondary to multiple potential etiologies. Admitted with grand mal seizures, hyperglycemia with questionable DKA versus hyperosmolar state, acute renal failure, and very brittle glucose control. 10/30 episode of bradycardia and was briefly on pressors. Required increased FIO2. 1031 started on propofol for burst suppression of seizures per neurology 1031 started on Neo-Synephrine for hypotension secondary to propofol  -Vent dependent respiratory failure in the setting of bilateral cerebral infarcts, combined metabolic and respiratory acidosis.   Continue current FiO2 and wean as tolerated. Not weanable.  -Acute encephalopathy multifactorial in nature stroke noted on MRI per neurology  -Seizures per neurology.  Currently on propofol with increased antiseizure medication per neurology.  Continues to have seizure activity despite being on propofol.  Has finished a 24-hour EEG continues to have focal seizures despite sedation.  This is indicative of poor prognosis.  -Hyperglycemia in the setting of diabetes.  CBG (last 3)   Recent Labs  09-30-2017 0335 2017/09/30 0400 09-30-2017 0813  GLUCAP 69 115* 69     Lantus added to pharmaceutical regimen 10/30 Noticed that his blood glucose is  decreased.  His tube feedings on hold secondary to nausea and vomiting.   Lantus discontinued on 10/31/2018until tube feedings resume  -Electrolyte imbalance corrected 10/30 follow-up labs scheduled.  Repeat lactic acid was normal.  Potassium was replete.  Labs from 09-30-2017 are pending at this time. We will correct electrolytes as needed when labs are available.  -Septic shock secondary to suspected urinary tract infection.  Day 5 of vancomycin and Azactam.  No positive culture data at this time. Will dc vanco 11/1. Sepsis resolved.  -CODE STATUS he remains a full code at this time    Prophylaxis:  Protonix via tube daily & SCDs.  Heparin Warrens q8hr.  Diet:  NPO. tube feeds on hold for vomiting 10/31 Code Status:  Full Code pre previous physician discussion. Disposition:  Remains critically ill in the ICU. Social Worker consulted on admission.  Family Update: 11/1 no family at bedside Neurology is having ongoing discussions  with the family.  On Sep 18, 2017 plan is for the family to meet with neurology and the question of comfort care will be addressed at that time.  His prognosis remains extremely poor and has made no progress whatsoever.  We continue full ventilatory support.  His CODE STATUS remains full code at this time.  We will involve palliative care to assist in the transition to comfort care if that is the family's wish.   App CCT 40 min  Richardson Landry Minor ACNP Maryanna Shape PCCM Pager [phone] till 1 pm If no answer page [phone] September 18, 2017, 9:04 AM     -------- STAFF [name] ?I, Evans Lance, MD have personally reviewed patient's chart data, including medical history, events of [name], physical examination and test results as part of my evaluation.I have discussed with NP and other care providers involved this patient's care. I personally evaluated patient. Please see my comments as follows.   Remains comatose, on cEEG monitoring,on propofol &    Neosynephrine  Not tolerated tube  feeds. Breath sounds equal, no murmur, abdo-soft; tone normal. cxr- subsegmental atelectases./ pleural effusions/ pneumonia Labs reviewed.   This was an abnormal continuous video EEG due to diffuse slow activity and a sedated background currently. There appeared to be resolution of left anteirorperiodic discharges with increasing sedation over the past 24 hours. No seizures were seen.  Multiple cerebral infarcts on MR brain, dementia, seizure, metabolic encephalopathy, acute respiratory failure  with hypoxia, hypokalemia. D/w Neurology. To continue vimpat and keppra. Continuous EEG monitoring; Await Neurology d/w family reg prognosis and goals of care. Continue vent support pending family decisions;  Palliative Care input would be appropriate. Prognosis poor. Extubated to comfort care.  ---------------------------  Rest per NP whose [name] is outlined above that I agree with.  Thank you for letting me participate in the care of your patient.  The patient is critically ill with multiple organ systems failure and requires high complexity decision making for assessment and support, frequent evaluation and titration of therapies, application of advanced monitoring technologies and extensive interpretation of multiple databases like review of chart, labs, imaging, coordinating care with other physicians and healthcare team members.. ? Critical Care Time devoted to patient care services described in this [name] is 45 Minutes.?This time reflects time of care of this signee. This critical care time does not reflect procedure time, or teaching time or supervisory time of NP, but could involve care discussion time.  [name] subject to typographical and grammatical errors; Any formal questions or concerns about the content, text, or information contained within the body of this dictation should be directly addressed to the physician for clarification.   Evans Lance, MD  Pulmonary and [place] Pager: [phone]

## 2017-10-16 NOTE — Procedures (Signed)
Extubation Procedure Note  Patient Details:   Name: Alejandro [name] DOB: 1941-09-18 MRN: [id number]   Airway Documentation:     Evaluation  O2 sats: stable throughout Complications: No apparent complications Patient did tolerate procedure well. Bilateral Breath Sounds: Rhonchi   No pt not able to speak.  No distress noted during withdrawal of life support process.  Family at bedside and all questions answered prior to extubation.   Jennette KettleBrowning, Zaela Graley Joy 10/12/2017, 1:39 PM

## 2017-10-16 NOTE — Progress Notes (Signed)
LTM discontinued; mild skin irritation around frontal leads. Dr Dorthea CoveSass notified of takedown.

## 2017-10-16 DEATH — deceased

## 2018-04-25 IMAGING — DX DG CHEST 1V PORT
1 series · 1 of 1 positions shown · non-contrast
Comparison: 09/15/2017 .

CLINICAL DATA: Respiratory failure.

EXAM:
PORTABLE CHEST 1 VIEW

[chest]
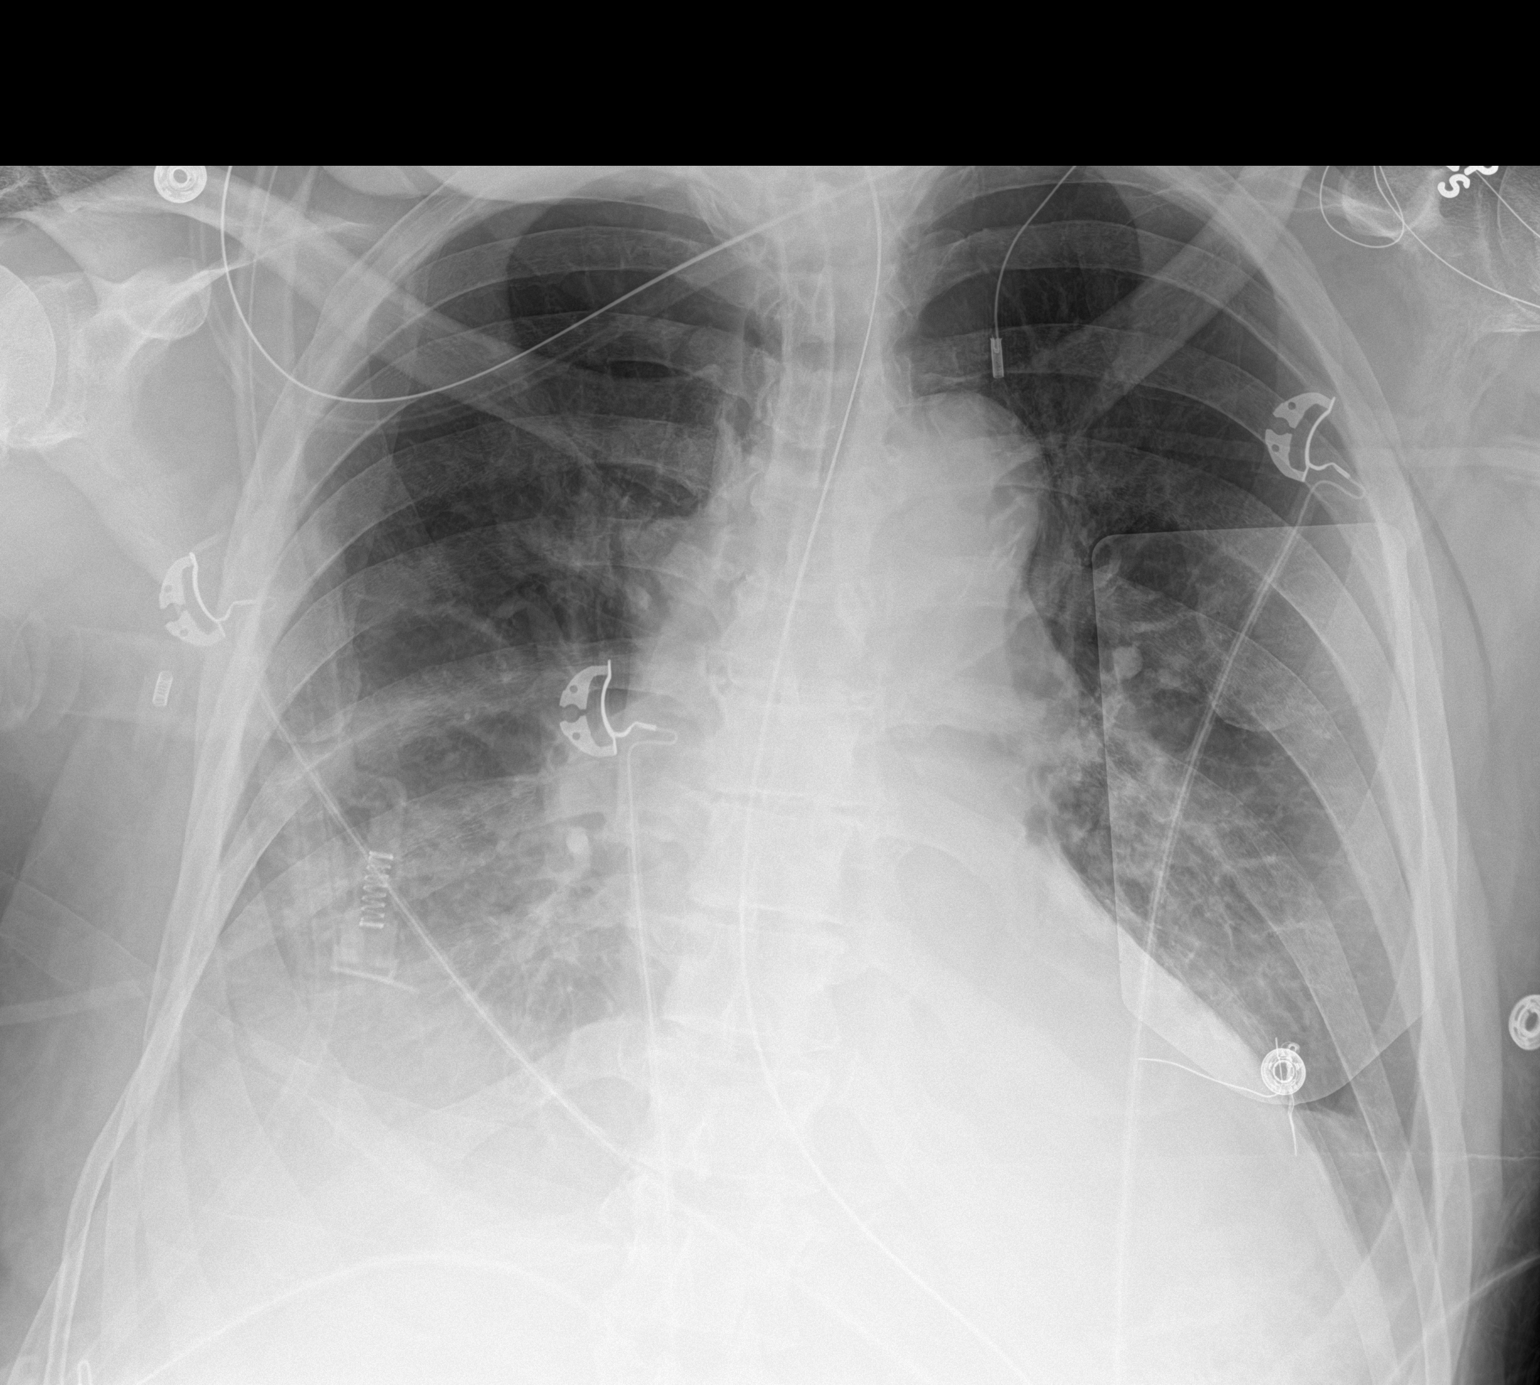

[1 of 1 positions shown; findings below may reference images not displayed]

FINDINGS: Endotracheal tube and NG tube in stable position. Stable
cardiomegaly. Persistent bilateral pulmonary infiltrates/ edema,
slightly progressed from prior exam. Mild basilar atelectasis. Small
bilateral pleural effusion noted on today's exam. No pneumothorax.
No acute bony abnormality .
IMPRESSION: 1. Lines and tubes in stable position.

2. Stable cardiomegaly. Bilateral pulmonary infiltrates/ edema again
noted slightly progressed from prior exam. Small bilateral pleural
effusions noted on today's exam .
